# Patient Record
Sex: Male | Born: 1940 | Race: White | Hispanic: No | Smoking: Former smoker
Health system: Southern US, Community
[De-identification: ages and names within clinical notes are randomized; demographics above are authoritative.]

## PROBLEM LIST (undated history)

## (undated) DIAGNOSIS — M1990 Unspecified osteoarthritis, unspecified site: Secondary | ICD-10-CM

## (undated) DIAGNOSIS — M542 Cervicalgia: Secondary | ICD-10-CM

## (undated) DIAGNOSIS — I10 Essential (primary) hypertension: Secondary | ICD-10-CM

## (undated) DIAGNOSIS — G8929 Other chronic pain: Secondary | ICD-10-CM

## (undated) DIAGNOSIS — M545 Low back pain: Secondary | ICD-10-CM

## (undated) HISTORY — DX: Unspecified osteoarthritis, unspecified site: M19.90

## (undated) HISTORY — DX: Essential (primary) hypertension: I10

## (undated) HISTORY — DX: Other chronic pain: G89.29

## (undated) HISTORY — DX: Low back pain: M54.5

## (undated) HISTORY — DX: Cervicalgia: M54.2

---

## 1998-04-13 ENCOUNTER — Emergency Department (HOSPITAL_COMMUNITY): Admission: EM | Admit: 1998-04-13 | Discharge: 1998-04-13 | Payer: Self-pay | Admitting: Emergency Medicine

## 1998-04-14 ENCOUNTER — Emergency Department (HOSPITAL_COMMUNITY): Admission: EM | Admit: 1998-04-14 | Discharge: 1998-04-14 | Payer: Self-pay | Admitting: Internal Medicine

## 1998-04-15 ENCOUNTER — Emergency Department (HOSPITAL_COMMUNITY): Admission: EM | Admit: 1998-04-15 | Discharge: 1998-04-15 | Payer: Self-pay | Admitting: Emergency Medicine

## 1999-06-09 ENCOUNTER — Emergency Department (HOSPITAL_COMMUNITY): Admission: EM | Admit: 1999-06-09 | Discharge: 1999-06-09 | Payer: Self-pay | Admitting: Emergency Medicine

## 1999-06-09 ENCOUNTER — Encounter: Payer: Self-pay | Admitting: Emergency Medicine

## 1999-06-25 ENCOUNTER — Encounter: Payer: Self-pay | Admitting: Internal Medicine

## 1999-06-25 ENCOUNTER — Ambulatory Visit (HOSPITAL_COMMUNITY): Admission: RE | Admit: 1999-06-25 | Discharge: 1999-06-25 | Payer: Self-pay | Admitting: Internal Medicine

## 1999-06-25 ENCOUNTER — Encounter: Admission: RE | Admit: 1999-06-25 | Discharge: 1999-06-25 | Payer: Self-pay | Admitting: Internal Medicine

## 1999-07-02 ENCOUNTER — Encounter: Admission: RE | Admit: 1999-07-02 | Discharge: 1999-07-02 | Payer: Self-pay | Admitting: Internal Medicine

## 2002-06-16 ENCOUNTER — Ambulatory Visit (HOSPITAL_BASED_OUTPATIENT_CLINIC_OR_DEPARTMENT_OTHER): Admission: RE | Admit: 2002-06-16 | Discharge: 2002-06-16 | Payer: Self-pay | Admitting: Orthopedic Surgery

## 2002-11-09 ENCOUNTER — Emergency Department (HOSPITAL_COMMUNITY): Admission: EM | Admit: 2002-11-09 | Discharge: 2002-11-09 | Payer: Self-pay | Admitting: Emergency Medicine

## 2004-03-18 DIAGNOSIS — M545 Low back pain, unspecified: Secondary | ICD-10-CM

## 2004-03-18 DIAGNOSIS — G8929 Other chronic pain: Secondary | ICD-10-CM

## 2004-03-18 HISTORY — PX: SHOULDER SURGERY: SHX246

## 2004-03-18 HISTORY — DX: Other chronic pain: G89.29

## 2004-03-18 HISTORY — DX: Low back pain, unspecified: M54.50

## 2011-05-03 ENCOUNTER — Encounter: Payer: Self-pay | Admitting: Family Medicine

## 2011-05-03 ENCOUNTER — Ambulatory Visit (INDEPENDENT_AMBULATORY_CARE_PROVIDER_SITE_OTHER): Payer: BC Managed Care – PPO | Admitting: Family Medicine

## 2011-05-03 ENCOUNTER — Ambulatory Visit: Payer: BC Managed Care – PPO

## 2011-05-03 DIAGNOSIS — M5136 Other intervertebral disc degeneration, lumbar region: Secondary | ICD-10-CM | POA: Insufficient documentation

## 2011-05-03 DIAGNOSIS — Z0000 Encounter for general adult medical examination without abnormal findings: Secondary | ICD-10-CM

## 2011-05-03 DIAGNOSIS — M545 Low back pain: Secondary | ICD-10-CM

## 2011-05-03 DIAGNOSIS — Z1212 Encounter for screening for malignant neoplasm of rectum: Secondary | ICD-10-CM

## 2011-05-03 DIAGNOSIS — I10 Essential (primary) hypertension: Secondary | ICD-10-CM | POA: Insufficient documentation

## 2011-05-03 DIAGNOSIS — N400 Enlarged prostate without lower urinary tract symptoms: Secondary | ICD-10-CM | POA: Insufficient documentation

## 2011-05-03 DIAGNOSIS — E669 Obesity, unspecified: Secondary | ICD-10-CM | POA: Insufficient documentation

## 2011-05-03 DIAGNOSIS — Z23 Encounter for immunization: Secondary | ICD-10-CM

## 2011-05-03 LAB — COMPREHENSIVE METABOLIC PANEL
ALT: 22 U/L (ref 0–53)
AST: 21 U/L (ref 0–37)
Albumin: 4.7 g/dL (ref 3.5–5.2)
Alkaline Phosphatase: 93 U/L (ref 39–117)
BUN: 21 mg/dL (ref 6–23)
CO2: 23 mEq/L (ref 19–32)
Calcium: 9.3 mg/dL (ref 8.4–10.5)
Chloride: 101 mEq/L (ref 96–112)
Creat: 1.21 mg/dL (ref 0.50–1.35)
Glucose, Bld: 83 mg/dL (ref 70–99)
Potassium: 4.5 mEq/L (ref 3.5–5.3)
Sodium: 137 mEq/L (ref 135–145)
Total Bilirubin: 1.2 mg/dL (ref 0.3–1.2)
Total Protein: 7.4 g/dL (ref 6.0–8.3)

## 2011-05-03 LAB — LIPID PANEL
Cholesterol: 169 mg/dL (ref 0–200)
HDL: 35 mg/dL — ABNORMAL LOW (ref >39)
LDL Cholesterol: 110 mg/dL — ABNORMAL HIGH (ref 0–99)
Total CHOL/HDL Ratio: 4.8 Ratio
Triglycerides: 122 mg/dL (ref <150)
VLDL: 24 mg/dL (ref 0–40)

## 2011-05-03 MED ORDER — MUPIROCIN 2 % EX OINT: TOPICAL_OINTMENT | Freq: Three times a day (TID) | CUTANEOUS | Status: DC

## 2011-05-03 MED ORDER — TRAMADOL HCL 50 MG PO TABS: ORAL_TABLET | ORAL | Status: DC

## 2011-05-03 MED ORDER — MUPIROCIN 2 % EX OINT: TOPICAL_OINTMENT | Freq: Two times a day (BID) | CUTANEOUS | Status: AC

## 2011-05-03 NOTE — Progress Notes (Signed)
Subjective:    Patient ID: Bradley Farrell, male    DOB: 11/29/1940, 71 y.o.   MRN: [id number]  HPI     This 71 y.o.Cauc male is here for annual exam and some screening tests. He wants a Diabetes  screening test, colorectal screening and any other recommended evaluations. He has HTN and is compliant  with medication; no CP, dizziness, palpitations. No medication side effects. He is wondering whether he  is on adequate dosing as BP measurements outside this office are often higher than normal. He  acknowledges that he needs to lose weight and eat better . He is having increased chronic LBP,  aggravated by prolonged sitting. He feels sore/stiff in the AM upon awakening. He denies radicular  pain, numbness or weakness. His work involves heavy lifting which he is able to do without difficulty.      Review of Systems  Constitutional: Negative.   HENT:       Dry nasal passages not relieved with OTC salves and nasal sprays  Eyes: Negative.   Respiratory: Negative.   Cardiovascular: Negative.   Gastrointestinal: Negative.   Genitourinary: Positive for frequency, penile pain and testicular pain. Negative for dysuria, hematuria, decreased urine volume and difficulty urinating.       Positive for hesitancy and nocturia.  Musculoskeletal: Positive for back pain. Negative for arthralgias and gait problem.  Neurological: Negative.   Hematological: Negative.   Psychiatric/Behavioral: Positive for sleep disturbance.       Sleeps only 4-5 hours each night.       Objective:   Physical Exam  Vitals reviewed. Constitutional: He is oriented to person, place, and time. He appears well-developed and well-nourished. No distress.       Moderate abdominal obesity  HENT:  Head: Normocephalic and atraumatic.  Left Ear: External ear normal.  Mouth/Throat: Oropharynx is clear and moist.       Nasal mucosa erythematous and dry. No lesions   Neck: Normal range of motion. Neck supple. No thyromegaly  present.  Cardiovascular: Normal rate, regular rhythm, normal heart sounds and intact distal pulses.  Exam reveals no gallop.   No murmur heard.      Distal pulses- 1+/=  Pulmonary/Chest: Effort normal and breath sounds normal. No respiratory distress. He has no wheezes.  Abdominal: Soft. Bowel sounds are normal. He exhibits no mass. There is no tenderness.  Genitourinary: Guaiac positive stool.       Prostate enlarged and firm with right lobe larger and firmer than left  Musculoskeletal: He exhibits no edema and no tenderness.       Lower back: nontender with palpation. SLR - negative bilat to >60 degrees  Lymphadenopathy:    He has no cervical adenopathy.  Neurological: He is alert and oriented to person, place, and time. He exhibits normal muscle tone. Coordination normal.       DTRs 1+ in upper and lower ext.  Skin: Skin is warm and dry. No rash noted.  Psychiatric: He has a normal mood and affect. His behavior is normal. Judgment and thought content normal.   Results for orders placed in visit on 05/03/11  IFOBT (OCCULT BLOOD)      Component Value Range   IFOBT Positive     ECG: NSR, no STTW changes. No ectopy  [facility] reading (PRIMARY) by  Dr. Audria Nine: Lumbosacral spine films-  Moderately severe diffuse spondylosis and multilevel disc space narrowing  Assessment & Plan:   1. Routine general medical examination at a health care facility  Flu vaccine greater than or equal to 3yo preservative free IM, Comprehensive metabolic panel, Lipid panel, EKG 12-Lead, IFOBT POC (occult bld, rslt in office)  2. HTN (hypertension)  EKG 12-Lead; Continue Amlodipine 10 mg daily  3. Chronic low back pain  DG Lumbar Spine Complete; Tramadol HCl 50 mg   1 tab every 8 hrs prn or 1 tab at bedtime.  If pain persists, will need an MRI to R/O Nerve Impingement or HNP  4. Enlarged prostate  PSA pending; will need  Urologic evaluation  5. Immunization due  Flu vaccine given; pt received Pneumonia vaccine in 2009  6. Screening for colorectal cancer  Ambulatory referral to Gastroenterology  7. Obesity (BMI 30.0-34.9)  Offered Nutrition Counseling but pt declined; he has joined [employer] and plans to start swimming and adjust diet to promote weight loss.  8. Lumbar degenerative disc disease  Weight loss; Rx: Tramadol HCl 50 mg 1 tab every 8 hours or 1-2 at night for pain.  9. Health care maintenance  Ref to GI for Colorectal screening; pt had + Hemosure And is advised to have Colonoscopy.

## 2011-05-03 NOTE — Patient Instructions (Addendum)
Medications prescribed today:  Bactroban ointment to apply inside your nose twice a day (you will be getting the Generic) Tramadol 50 mg   Take one tablet at bedtime to help reduce back pain.     Colorectal Cancer Screening Colorectal cancer screening is done to detect early disease. Colorectal refers to the colon and rectum. The colon and rectum are located at the end of the large intestine (digestive system), and carry your bowel movements out of the body. Screening may be done even if you are not experiencing symptoms.  Colorectal cancer screening checks for:  Polyps. These are small growths in the lining of the colon that can turn cancerous.   Cancer that is already growing. Cancer is a cluster of abnormal cells that can cause problems in the body.  REASONS FOR COLORECTAL CANCER SCREENING  It is common for polyps to form in the lining of the colon, especially in older people. These polyps can be cancerous or become cancerous.   Caught early, colorectal cancer is treatable.   Cancer can be life threatening. Detecting or preventing cancer early can save your life and allow you to enjoy life longer.  TYPES OF SCREENING  Fecal occult blood testing. A stool sample is examined for blood in the laboratory.   Sigmoidoscopy. A sigmoidoscope is used to examine the rectum and lower colon. A sigmoidoscope is a flexible tube with a camera that is inserted through your anus to examine your lower rectum.   Colonoscopy. The longer colonoscope is used to examine the entire colon. A colonoscope is also a thin, flexible tube with a camera. This test examines the colon and rectum.  Other tests include:  Digital rectal exam.   Barium enema.   Stool DNA test.   Virtual colonoscopy is the use of computerized X-ray scan (computed tomography, CT) to take X-ray images of your colon.  WHO SHOULD HAVE COLORECTAL CANCER SCREENING?  Screening is recommended for all adults aged 89 to 75 years.    Screening is generally done every 5 to 10 years or more frequently if you have a family history or symptoms.  Screening is rarely recommended in adults aged 40 to 85 years. Screening is not recommended in adults aged 46 years and older. Your caregiver may recommend screening at a younger age and more frequent screening if you have:  A history of colorectal cancer or polyps.   Family members with histories of colorectal cancer or polyps.   Inflammatory bowel disease, such as ulcerative colitis or Crohn's disease.   A type of hereditary colon cancer syndrome.  Talk with your caregiver about any symptoms, personal and family history. SYMPTOMS OF COLORECTAL CANCER It is important to discuss the following symptoms with your caregiver. These symptoms may be the result of other conditions and may be easily treated:  Rectal bleeding.   Blood in your stool.   Changes in bowel movements (hard or loose stools). These changes may last several weeks.   Abdominal cramping.   Feeling the pressure to have a bowel movement when there is no bowel movement.   Feeling tired or weak.   Unexplained weight loss.   Unexplained low red blood cell count. This may also be called iron deficiency anemia.  HOME CARE INSTRUCTIONS   Follow up with your caregiver as directed.   Follow all instructions for preparation before your test as well as after.  PREVENTION  Following healthy lifestyle habits each day can reduce your chance of getting colorectal cancer and  many other types of cancer:  Eat a healthy, well-balanced diet rich in fruits and vegetables and low in fats, sugars and cholesterol.   Stay active. Try to exercise at least 4 to 6 times per week for 30 minutes.   Maintain a healthy weight. Ask your caregiver what a healthy weight range is for you.   Women should only drink 1 alcoholic drink per day. Men should only drink 2 alcoholic drinks per day.   Quit smoking.  SEEK MEDICAL CARE IF:    You experience abdominal or rectal symptoms (see Symptoms of Colorectal Cancer).   Your gastrointestinal issues (constipation, diarrhea) do not go away as expected.   You have questions or concerns.  FOR MORE INFORMATION  American Academy of Family Physicians www.familydoctor.org   Centers for Disease Control and Prevention FootballExhibition.com.br   [place] Preventive Services Task Force www.uspreventiveservicestaskforce.org   American Cancer Society www.cancer.org  MAKE SURE YOU:   Understand these instructions.   Will watch your condition.   Will get help right away if you are not doing well or get worse.  Always follow up with your caregiver to find out the results of your tests. Not all test results may be available during your visit. If your test results are not back during the visit, make an appointment with your caregiver to find out the results. Do not assume everything is normal if you have not heard from your caregiver or the medical facility. It is important for you to follow up on all of your test results.  Document Released: 08/22/2009 Document Revised: 11/14/2010 Document Reviewed: 08/22/2009 [employer] Patient Information 2012 [employer], [place]   Benign Prostatic Hypertrophy  The prostate gland is part of the reproductive system of men. A normal prostate is about the size and shape of a walnut. The prostate gland makes a fluid that is mixed with sperm to make semen. This gland surrounds the urethra and is located in front of the rectum and just below the bladder. The bladder is where urine is stored. The urethra is the tube through which urine passes from the bladder to get out of the body. The prostate grows as a man ages. An enlarged prostate not caused by cancer is called benign prostatic hypertrophy (BPH). This is a common health problem in men over age 41. This condition is a normal part of aging. An enlarged prostate presses on the urethra. This makes it harder to pass urine. In the  early stages of enlargement, the bladder can get by with a narrowed urethra by forcing the urine through. If the problem gets worse, medical or surgical treatment may be required.  This condition should be followed by your caregiver. Longstanding back pressure on the kidneys can cause infection. Back pressure and infection can progress to bladder damage and kidney (renal) failure. If needed, your caregiver may refer you to a specialist in kidney and prostate disease (urologist). CAUSES  The exact cause is not known.  SYMPTOMS   You are not able to completely empty your bladder.   Getting up often during the night to urinate.   Need to urinate frequently during the day.   Difficultly in starting urine flow.   Decrease in size and strength of the urine stream.   Dribbling after urination.   Pain on urination (more common with infection).   Inability to pass your water. This needs immediate treatment.  DIAGNOSIS  These tests will help your caregiver understand your problem:  Digital rectal exam (DRE). In  a rectal exam, your caregiver checks your prostate by putting a gloved, lubricated finger into the rectum to feel the back of your prostate gland. This exam detects the size of the gland and abnormal lumps or growths.   Urinalysis (exam of the urine). This may include a culture if there is concern about infection.   Prostate Specific Antigen (PSA). This is a blood test used to screen for prostate cancer. It is not used alone for diagnosing prostate cancer.   Rectal ultrasound (sonogram). This test uses sound waves to electronically produce a "picture" of the prostate. It helps examine the prostate gland for cancer.  TREATMENT  Mild symptoms may not need treatment. Simple observation and yearly exams may be all that is required. Medications and surgery are options for more severe problems. Your caregiver can help you make an informed decision for what is best. Two classes of medications  are available for relief of prostate symptoms:  Medications that shrink the prostate. This helps relieve symptoms.   Uncommon side effects include problems with sexual function.   Medications to relax the muscle of the prostate. This also relieves the obstruction.   Side effects can include dizziness, fatigue, lightheadedness, and retrograde ejaculation (diminished volume of ejaculate).  Several types of surgical treatments are available for relief of prostate symptoms:  Transurethral resection of the prostate (TURP). In this treatment, an instrument is inserted through opening at the tip of the penis. It is used to cut away pieces of the inner core of the prostate. The pieces are removed through the same opening of the penis. This removes the obstruction and helps get rid of the symptoms.   Transurethral incision (TUIP). In this procedure, small cuts are made in the prostate. This lessens the prostates pressure on the urethra.   Transurethral microwave thermotherapy (TUMT). This procedure uses microwaves to create heat. The heat destroys and removes a small amount of prostate tissue.   Transurethral needle ablation (TUNA). This is a procedure that uses radio frequencies to do the same as TUMT.   Interstitial laser coagulation (ILC). This is a procedure that uses a laser to do the same as TUMT and TUNA.   Transurethral electrovaporization (TUVP). This is a procedure that uses electrodes to do the same as the procedures listed above.  Regardless of the method of treatment chosen, you and your caregiver will discuss the options. With this knowledge, you along with your caregiver can decide upon the best treatment for you. SEEK MEDICAL CARE IF:   You develop chills, fever of 100.5 F (38.1 C), or night sweats.   There is unexplained back pain.   Symptoms are not helped by medications prescribed.   You develop medication side effects.   Your urine becomes very dark or has a bad smell.   SEEK IMMEDIATE MEDICAL CARE IF:   You are suddenly unable to urinate. This is an emergency. You should be seen immediately.   There are large amounts of blood or clots in the urine.   Your urinary problems become unmanageable.   You develop lightheadedness, severe dizziness, or you feel faint.   You develop moderate to severe low back or flank pain.   You develop chills or fever.  Document Released: 03/04/2005 Document Revised: 11/14/2010 Document Reviewed: 11/24/2006 [facility] Patient Information 2012 [place], [place].   .Degenerative Disc Disease Degenerative disc disease is a condition caused by the changes that occur in the cushions of the backbone (spinal discs) as you grow older. Spinal discs  are soft and compressible discs located between the bones of the spine (vertebrae). They act like shock absorbers. Degenerative disc disease can affect the wholespine. However, the neck and lower back are most commonly affected. Many changes can occur in the spinal discs with aging, such as:  The spinal discs may dry and shrink.   Small tears may occur in the tough, outer covering of the disc (annulus).   The disc space may become smaller due to loss of water.   Abnormal growths in the bone (spurs) may occur. This can put pressure on the nerve roots exiting the spinal canal, causing pain.   The spinal canal may become narrowed.  CAUSES  Degenerative disc disease is a condition caused by the changes that occur in the spinal discs with aging. The exact cause is not known, but there is a genetic basis for many patients. Degenerative changes can occur due to loss of fluid in the disc. This makes the disc thinner and reduces the space between the backbones. Small cracks can develop in the outer layer of the disc. This can lead to the breakdown of the disc. You are more likely to get degenerative disc disease if you are overweight. Smoking cigarettes and doing heavy work such as weightlifting can  also increase your risk of this condition. Degenerative changes can start after a sudden injury. Growth of bone spurs can compress the nerve roots and cause pain.  SYMPTOMS  The symptoms vary from person to person. Some people may have no pain, while others have severe pain. The pain may be so severe that it can limit your activities. The location of the pain depends on the part of your backbone that is affected. You will have neck or arm pain if a disc in the neck area is affected. You will have pain in your back, buttocks, or legs if a disc in the lower back is affected. The pain becomes worse while bending, reaching up, or with twisting movements. The pain may start gradually and then get worse as time passes. It may also start after a major or minor injury. You may feel numbness or tingling in the arms or legs.  DIAGNOSIS  Your caregiver will ask you about your symptoms and about activities or habits that may cause the pain. He or she may also ask about any injuries, diseases, ortreatments you have had earlier. Your caregiver will examine you to check for the range of movement that is possible in the affected area, to check for strength in your extremities, and to check for sensation in the areas of the arms and legs supplied by different nerve roots. An X-ray of the spine may be taken. Your caregiver may suggest other imaging tests, such as a computerized magnetic scan (MRI), if needed.  TREATMENT  Treatment includes rest, modifying your activities, and applying ice and heat. Your caregiver may prescribe medicines to reduce your pain and may ask you to do some exercises to strengthen your back. In some cases, you may need surgery. You and your caregiver will decide on the treatment that is best for you. HOME CARE INSTRUCTIONS   Follow proper lifting and walking techniques as advised by your caregiver.   Maintain good posture.   Exercise regularly as advised.   Perform relaxation exercises.    Change your sitting, standing, and sleeping habits as advised. Change positions frequently.   Lose weight as advised.   Stop smoking if you smoke.   Wear supportive footwear.  SEEK MEDICAL CARE IF:  The pain does not go away within 1 to 4 weeks. SEEK IMMEDIATE MEDICAL CARE IF:   The pain is severe.   You notice weakness in your arms, hands, or legs.   You begin to lose control of your bladder or bowel.  MAKE SURE YOU:   Understand these instructions.   Will watch your condition.   Will get help right away if you are not doing well or get worse.  Document Released: 12/30/2006 Document Revised: 11/14/2010 Document Reviewed: 12/30/2006 [facility] Patient Information 2012 [place], [place].

## 2011-05-08 NOTE — Progress Notes (Signed)
Quick Note:  Please call pt and advise that Lipid profile show low HDL ("good") cholesterol and LDL ("bad") chol is a little elevated. Best way to normalize these is with regular exercise and healthy nutrition which includes Omega 3 Fish Oil and fish - Salmon, tuna and mackerel. PSA (prostate test) Is normal.  Provide copy of labs to pt. ______

## 2011-06-05 ENCOUNTER — Other Ambulatory Visit: Payer: Self-pay

## 2011-06-05 MED ORDER — AMLODIPINE BESYLATE 10 MG PO TABS: 10.0000 mg | ORAL_TABLET | Freq: Every day | ORAL | Status: DC

## 2011-06-05 NOTE — Telephone Encounter (Signed)
Pt came into [address] lobby and stated his BP med had not been RFd at his CPE last month and asked if we could send it into pharmacy. Sending Rx to [facility]

## 2011-06-07 ENCOUNTER — Ambulatory Visit (INDEPENDENT_AMBULATORY_CARE_PROVIDER_SITE_OTHER): Payer: BC Managed Care – PPO | Admitting: Internal Medicine

## 2011-06-07 VITALS — BP 126/81 | HR 93 | Temp 98.0°F | Resp 16 | Ht 71.0 in | Wt 231.0 lb

## 2011-06-07 DIAGNOSIS — M1990 Unspecified osteoarthritis, unspecified site: Secondary | ICD-10-CM

## 2011-06-07 DIAGNOSIS — J9801 Acute bronchospasm: Secondary | ICD-10-CM

## 2011-06-07 DIAGNOSIS — R05 Cough: Secondary | ICD-10-CM

## 2011-06-07 DIAGNOSIS — M129 Arthropathy, unspecified: Secondary | ICD-10-CM

## 2011-06-07 DIAGNOSIS — R059 Cough, unspecified: Secondary | ICD-10-CM

## 2011-06-07 DIAGNOSIS — G8929 Other chronic pain: Secondary | ICD-10-CM

## 2011-06-07 MED ORDER — ALBUTEROL SULFATE HFA 108 (90 BASE) MCG/ACT IN AERS: 2.0000 | INHALATION_SPRAY | Freq: Four times a day (QID) | RESPIRATORY_TRACT | Status: AC | PRN

## 2011-06-07 MED ORDER — HYDROCODONE-ACETAMINOPHEN 7.5-500 MG/15ML PO SOLN: 5.0000 mL | Freq: Four times a day (QID) | ORAL | Status: AC | PRN

## 2011-06-07 MED ORDER — METHYLPREDNISOLONE ACETATE 80 MG/ML IJ SUSP
80.0000 mg | Freq: Once | INTRAMUSCULAR | Status: AC
  Administered 2011-06-07: 80 mg via INTRAMUSCULAR

## 2011-06-07 MED ORDER — HYDROCODONE-ACETAMINOPHEN 5-500 MG PO TABS: 1.0000 | ORAL_TABLET | Freq: Three times a day (TID) | ORAL | Status: AC | PRN

## 2011-06-07 NOTE — Patient Instructions (Signed)
Bronchospasm  A bronchospasm is when the tubes that carry air in and out of your lungs (bronchioles) become smaller. It is hard to breathe when this happens. A bronchospasm can be caused by:   Asthma.   Allergies.   Lung infection.  HOME CARE    Do not  smoke. Avoid places that have secondhand smoke.   Dust your house often. Have your air ducts cleaned once or twice a year.   Find out what allergies may cause your bronchospasms.   Use your inhaler properly if you have one. Know when to use it.   Eat healthy foods and drink plenty of water.   Only take medicine as told by your doctor.  GET HELP RIGHT AWAY IF:   You feel you cannot breathe or catch your breath.   You cannot stop coughing.   Your treatment is not helping you breathe better.  MAKE SURE YOU:    Understand these instructions.   Will watch your condition.   Will get help right away if you are not doing well or get worse.  Document Released: 12/30/2008 Document Revised: 02/21/2011 Document Reviewed: 12/30/2008  [employer] Patient Information 2012 [employer].

## 2011-06-07 NOTE — Progress Notes (Signed)
  Subjective:    Patient ID: Bradley Farrell, male    DOB: [birth year], 71 y.o.   MRN: [id number]  HPI Persistnt cough after a cold. Hx of cxr at cpe recently. No sob, quit smoking over 1 yr. No sputum Wants cough controlled so he can go to work   Review of Systems     Objective:   Physical Exam  Constitutional: He is oriented to person, place, and time. He appears well-developed and well-nourished.  HENT:  Right Ear: External ear normal.  Left Ear: External ear normal.  Nose: Nose normal.  Mouth/Throat: Oropharynx is clear and moist.  Cardiovascular: Normal rate and regular rhythm.   Pulmonary/Chest: Effort normal. He has no decreased breath sounds. He has wheezes. He has rhonchi. He has no rales.  Neurological: He is alert and oriented to person, place, and time.  Skin: Skin is warm and dry.  Psychiatric: He has a normal mood and affect.          Assessment & Plan:  Bronchospasm Cough Chronic pain  Lortab elixir for cough Albuterol HFA prn and taught Vicodin 5/500 prn pain Depomedrol 120mg  IM

## 2011-07-09 ENCOUNTER — Encounter: Payer: Self-pay | Admitting: Gastroenterology

## 2011-12-21 ENCOUNTER — Other Ambulatory Visit: Payer: Self-pay | Admitting: Family Medicine

## 2012-01-24 ENCOUNTER — Ambulatory Visit (INDEPENDENT_AMBULATORY_CARE_PROVIDER_SITE_OTHER): Payer: BC Managed Care – PPO | Admitting: Emergency Medicine

## 2012-01-24 VITALS — BP 111/71 | HR 80 | Resp 16 | Ht 71.0 in | Wt 234.0 lb

## 2012-01-24 DIAGNOSIS — J018 Other acute sinusitis: Secondary | ICD-10-CM

## 2012-01-24 MED ORDER — AMOXICILLIN-POT CLAVULANATE 875-125 MG PO TABS: 1.0000 | ORAL_TABLET | Freq: Two times a day (BID) | ORAL | Status: DC

## 2012-01-24 MED ORDER — AMLODIPINE BESYLATE 10 MG PO TABS: 10.0000 mg | ORAL_TABLET | Freq: Every day | ORAL | Status: DC

## 2012-01-24 MED ORDER — PSEUDOEPHEDRINE-GUAIFENESIN ER 60-600 MG PO TB12: 1.0000 | ORAL_TABLET | Freq: Two times a day (BID) | ORAL | Status: DC

## 2012-01-24 NOTE — Progress Notes (Signed)
[facility] [address] [place] [address] [phone]  Date:  01/24/2012   Name:  Bradley Farrell   DOB:  December 16, 1940   MRN:  [id number]  PCP:  No primary provider on file.    Chief Complaint: Sore Throat and Medication Refill   History of Present Illness:  Bradley Farrell is a 71 y.o. very pleasant male patient who presents with the following:  Nasal congestion and clear nasal discharge.  Has green purulent post nasal drainage.  Has non productive cough occasionally green in color.  No fever or chills, wheezing or short of breath.  Exposed to a large amount of dust at work and is not wearing a respiratory.  No nausea or vomiting.  Patient Active Problem List  Diagnosis  . HTN (hypertension)  . Obesity (BMI 30.0-34.9)  . Lumbar degenerative disc disease  . Enlarged prostate  . Health care maintenance    Past Medical History  Diagnosis Date  . Arthritis   . Hypertension     No past surgical history on file.  History  Substance Use Topics  . Smoking status: Former Smoker    Types: Cigarettes    Quit date: 04/01/2010  . Smokeless tobacco: Not on file  . Alcohol Use: No    No family history on file.  No Known Allergies  Medication list has been reviewed and updated.  Current Outpatient Prescriptions on File Prior to Visit  Medication Sig Dispense Refill  . amLODipine (NORVASC) 10 MG tablet TAKE ONE TABLET BY MOUTH EVERY DAY  90 tablet  0  . aspirin 81 MG tablet Take 81 mg by mouth daily.      Marland Kitchen albuterol (PROVENTIL HFA;VENTOLIN HFA) 108 (90 BASE) MCG/ACT inhaler Inhale 2 puffs into the lungs every 6 (six) hours as needed for wheezing.  1 Inhaler  0    Review of Systems:  As per HPI, otherwise negative.    Physical Examination: Filed Vitals:   01/24/12 1435  BP: 111/71  Pulse: 80  Resp: 16   Filed Vitals:   01/24/12 1435  Height: 5\' 11"  (1.803 m)  Weight: 234 lb (106.142 kg)   Body mass index is 32.64 kg/(m^2). Ideal Body  Weight: Weight in (lb) to have BMI = 25: 178.9   GEN: WDWN, NAD, Non-toxic, A & O x 3.  No rash, sepsis or shortness of breath HEENT: Atraumatic, Normocephalic. Neck supple. No masses, No LAD.  Oropharynx negative Ears and Nose: No external deformity.  TM negative.  Green nasal drainage.  Tender maxillary sinuses CV: RRR, No M/G/R. No JVD. No thrill. No extra heart sounds. PULM: CTA B, no wheezes, crackles, rhonchi. No retractions. No resp. distress. No accessory muscle use. ABD: S, NT, ND, +BS. No rebound. No HSM. EXTR: No c/c/e NEURO Normal gait.  PSYCH: Normally interactive. Conversant. Not depressed or anxious appearing.  Calm demeanor.    Assessment and Plan: Sinusitis augmentin mucinex d Follow up as needed  Carmelina Dane, MD  Patient was very unhappy that he had no refills called in on his antihypertensive other than 30 days worth.  He was last seen in the office in February by Dr Audria Nine. I told him that we don't like to treat hypertension with a year prescription and that I would happily extend his prescription as he requested since he was seen.  He was not happy that he was not given "something to help him relax" so he could sleep, as he  had a lifelong history of insomnia that was never treated.  I suggested that he see his regular physician for that.  He became vulgar and cursed repeatedly and said he would not tolerate being "preached to" at his age.  Eleanora Neighbor was in the room for the entire interaction.

## 2012-01-26 ENCOUNTER — Telehealth: Payer: Self-pay

## 2012-01-26 NOTE — Telephone Encounter (Signed)
Dr. Dareen Piano suggests he tries OTC robatussin or delsym.

## 2012-01-26 NOTE — Telephone Encounter (Signed)
Patient notified. He stated that he was not taking OTC medication, he has come up here to get care for the cough and he has insurance and come up here with a co-pay to get medication and he is not getting any medication for the cough. He said it is a hard cough that is keeping him up at night. He wanted me to tell Dr. Dareen Piano that if this is the best he has then he will get his lawyer and find out what is up with him in court.

## 2012-01-26 NOTE — Telephone Encounter (Signed)
He would like something for cough. He has been coughing all night and non-stop.

## 2012-01-26 NOTE — Telephone Encounter (Signed)
Patient is not happy with just the antibiotics rececieved Friday.  He would like something in addition to them.  [facility] [place]  [phone] (H)

## 2012-01-29 NOTE — Progress Notes (Signed)
Reviewed and agree.

## 2012-06-13 ENCOUNTER — Ambulatory Visit (INDEPENDENT_AMBULATORY_CARE_PROVIDER_SITE_OTHER): Payer: BC Managed Care – PPO | Admitting: Family Medicine

## 2012-06-13 VITALS — BP 150/81 | HR 83 | Temp 97.8°F | Resp 16 | Ht 70.0 in | Wt 232.4 lb

## 2012-06-13 DIAGNOSIS — J209 Acute bronchitis, unspecified: Secondary | ICD-10-CM

## 2012-06-13 MED ORDER — LEVOFLOXACIN 500 MG PO TABS: 500.0000 mg | ORAL_TABLET | Freq: Every day | ORAL | Status: DC

## 2012-06-13 MED ORDER — PREDNISONE 20 MG PO TABS: ORAL_TABLET | ORAL | Status: DC

## 2012-06-13 NOTE — Patient Instructions (Addendum)

## 2012-06-13 NOTE — Progress Notes (Signed)
Patient ID: Bradley Farrell MRN: [id number], DOB: 1940/09/28, 72 y.o. Date of Encounter: 06/13/2012, 10:21 AM  Primary Physician: No primary provider on file.  Chief Complaint:  Chief Complaint  Patient presents with  . Cough    X  1 WEEK  CHEST CONGESTION  . Hypertension    MED REFILL     HPI: 72 y.o. year old male presents with a 14 day history of nasal congestion, post nasal drip, sore throat, and cough. Mild sinus pressure. Afebrile. No chills. Nasal congestion thick and green/yellow. Cough is productive of green/yellow sputum and not associated with time of day. Ears feel full, leading to sensation of muffled hearing. Has tried OTC cold preps without success. No GI complaints.   Patient does Holiday representative work.  Quit cigs 2 years ago  No sick contacts, recent antibiotics, or recent travels.   No leg trauma, sedentary periods, h/o cancer, or tobacco use.  Past Medical History  Diagnosis Date  . Arthritis   . Hypertension      Home Meds: Prior to Admission medications   Medication Sig Start Date End Date Taking? Authorizing Provider  amLODipine (NORVASC) 10 MG tablet Take 1 tablet (10 mg total) by mouth daily. 01/24/12  Yes Phillips Odor, MD  aspirin 81 MG tablet Take 81 mg by mouth daily.   Yes Historical Provider, MD    Allergies: No Known Allergies  History   Social History  . Marital Status: Divorced    Spouse Name: N/A    Number of Children: N/A  . Years of Education: N/A   Occupational History  . Not on file.   Social History Main Topics  . Smoking status: Former Smoker    Types: Cigarettes    Quit date: 04/01/2010  . Smokeless tobacco: Not on file  . Alcohol Use: No  . Drug Use: No  . Sexually Active: Yes   Other Topics Concern  . Not on file   Social History Narrative  . No narrative on file     Review of Systems: Constitutional: negative for chills, fever, night sweats or weight changes Cardiovascular: negative for chest pain or  palpitations Respiratory: negative for hemoptysis, wheezing, or shortness of breath Abdominal: negative for abdominal pain, nausea, vomiting or diarrhea Dermatological: negative for rash Neurologic: negative for headache   Physical Exam: Blood pressure 150/81, pulse 83, temperature 97.8 F (36.6 C), temperature source Oral, resp. rate 16, height 5\' 10"  (1.778 m), weight 232 lb 6.4 oz (105.416 kg), SpO2 98.00%., Body mass index is 33.35 kg/(m^2). General: Well developed, well nourished, in no acute distress. Head: Normocephalic, atraumatic, eyes without discharge, sclera non-icteric, nares are congested. Bilateral auditory canals clear, TM's are without perforation, pearly grey with reflective cone of light bilaterally. No sinus TTP. Oral cavity moist, dentition normal. Posterior pharynx with post nasal drip and mild erythema. No peritonsillar abscess or tonsillar exudate. Neck: Supple. No thyromegaly. Full ROM. No lymphadenopathy. Lungs: Coarse breath sounds bilaterally without wheezes, rales, or rhonchi. Breathing is unlabored.  Heart: RRR with S1 S2. No murmurs, rubs, or gallops appreciated. Msk:  Strength and tone normal for age. Extremities: No clubbing or cyanosis. No edema. Neuro: Alert and oriented X 3. Moves all extremities spontaneously. CNII-XII grossly in tact. Psych:  Responds to questions appropriately with a normal affect.    ASSESSMENT AND PLAN:  72 y.o. year old male with bronchitis. Acute bronchitis - Plan: levofloxacin (LEVAQUIN) 500 MG tablet, predniSONE (DELTASONE) 20 MG tablet   - -Mucinex -  Tylenol/Motrin prn -Rest/fluids -RTC precautions -RTC 3-5 days if no improvement  Signed, Elvina Sidle, MD 06/13/2012 10:21 AM

## 2012-07-11 ENCOUNTER — Ambulatory Visit (INDEPENDENT_AMBULATORY_CARE_PROVIDER_SITE_OTHER): Payer: BC Managed Care – PPO | Admitting: Physician Assistant

## 2012-07-11 VITALS — BP 135/84 | HR 73 | Temp 98.5°F | Resp 16 | Ht 71.0 in | Wt 232.0 lb

## 2012-07-11 DIAGNOSIS — J069 Acute upper respiratory infection, unspecified: Secondary | ICD-10-CM

## 2012-07-11 DIAGNOSIS — H9319 Tinnitus, unspecified ear: Secondary | ICD-10-CM | POA: Insufficient documentation

## 2012-07-11 DIAGNOSIS — M542 Cervicalgia: Secondary | ICD-10-CM | POA: Insufficient documentation

## 2012-07-11 DIAGNOSIS — J309 Allergic rhinitis, unspecified: Secondary | ICD-10-CM

## 2012-07-11 DIAGNOSIS — R600 Localized edema: Secondary | ICD-10-CM

## 2012-07-11 DIAGNOSIS — R609 Edema, unspecified: Secondary | ICD-10-CM

## 2012-07-11 MED ORDER — HYDROCOD POLST-CHLORPHEN POLST 10-8 MG/5ML PO LQCR: 5.0000 mL | Freq: Two times a day (BID) | ORAL | Status: DC | PRN

## 2012-07-11 MED ORDER — FLUTICASONE PROPIONATE 50 MCG/ACT NA SUSP: 2.0000 | Freq: Every day | NASAL | Status: DC

## 2012-07-11 MED ORDER — AZITHROMYCIN 250 MG PO TABS: ORAL_TABLET | ORAL | Status: DC

## 2012-07-11 NOTE — Patient Instructions (Addendum)
Get plenty of rest and drink at least 64 ounces of water daily. Please schedule a complete physical at your earliest convenience.  THE 3 SIMPLE RULES FOR NASAL SPRAY USE: 1. Don't snort. 2. Look at your toes and spray up your nose. 3. Use the opposite hand to spray in both nostrils.

## 2012-07-11 NOTE — Progress Notes (Signed)
  Subjective:    Patient ID: Bradley Farrell, male    DOB: 01/03/1941, 72 y.o.   MRN: [id number]  HPI This 72 y.o. male presents for evaluation of Cough and congestion. "I get this every month."  1 month ago, diagnosed with bronchitis, treated with Levaquin and prednisone.  Several months prior, Bradley Farrell was treated for sinusitis with Augmentin.  Notes that Bradley Farrell works primarily indoors, in Research scientist (life sciences) spaces, with people who have been sneezing and coughing, and not covering their mouths.  Past medical history, surgical history, family history, social history and problem list reviewed.  I last saw Bradley Farrell 08/31/2010. Last CPE was 04/2011 with Dr. Audria Nine.  Review of Systems As above. Notes several months of mild LE edema.  Resolves with leg elevation. Associated with long standing.  No associated pain. Otherwise NEGATIVE.    Objective:   Physical Exam Blood pressure 135/84, pulse 73, temperature 98.5 F (36.9 C), temperature source Oral, resp. rate 16, height 5\' 11"  (1.803 m), weight 232 lb (105.235 kg), SpO2 98.00%. Body mass index is 32.37 kg/(m^2). Well-developed, well nourished WM who is awake, alert and oriented, in NAD. HEENT: [place]/AT, PERRL, EOMI.  Sclera and conjunctiva are clear.  EAC are patent, TMs are normal in appearance. Nasal mucosa is pink and moist. OP is clear (compensated upper edentula). Neck: supple, non-tender, no lymphadenopathy, thyromegaly. Heart: RRR, no murmur Lungs: normal effort, CTA Extremities: no cyanosis, clubbing or edema. Skin: warm and dry without rash. Psychologic: good mood and appropriate affect, normal speech and behavior.     Assessment & Plan:  Viral URI with cough - Plan: azithromycin (ZITHROMAX) 250 MG tablet, as Bradley Farrell reports that when Bradley Farrell gets these symptoms, we ALWAYS needs an antibiotic; chlorpheniramine-HYDROcodone (TUSSIONEX PENNKINETIC ER) 10-8 MG/5ML LQCR  Allergic rhinitis - Plan: fluticasone (FLONASE) 50 MCG/ACT nasal spray; hopefully improved control  will prevent recurrent sinusitis.  Edema extremities - likely due to Amlodipine and long standing required of Bradley Farrell work.  Since it's not bothering Bradley Farrell, and Bradley Farrell BP is well controlled, Bradley Farrell will continue it for now.  If needed, it can be changed in the future.  Patient Instructions  Get plenty of rest and drink at least 64 ounces of water daily. Please schedule a complete physical at your earliest convenience.  THE 3 SIMPLE RULES FOR NASAL SPRAY USE: 1. Don't snort. 2. Look at your toes and spray up your nose. 3. Use the opposite hand to spray in both nostrils.    Fernande Bras, PA-C Physician Assistant-Certified [facility]

## 2012-09-15 ENCOUNTER — Other Ambulatory Visit: Payer: Self-pay | Admitting: Emergency Medicine

## 2012-09-16 NOTE — Telephone Encounter (Signed)
Patient due for labs/ov

## 2012-10-28 ENCOUNTER — Other Ambulatory Visit: Payer: Self-pay | Admitting: Physician Assistant

## 2012-11-12 ENCOUNTER — Ambulatory Visit (INDEPENDENT_AMBULATORY_CARE_PROVIDER_SITE_OTHER): Payer: BC Managed Care – PPO | Admitting: Family Medicine

## 2012-11-12 VITALS — BP 130/82 | HR 95 | Temp 98.0°F | Resp 18 | Ht 70.5 in | Wt 240.0 lb

## 2012-11-12 DIAGNOSIS — I10 Essential (primary) hypertension: Secondary | ICD-10-CM

## 2012-11-12 LAB — BASIC METABOLIC PANEL
BUN: 20 mg/dL (ref 6–23)
Potassium: 4.4 mEq/L (ref 3.5–5.3)
Sodium: 140 mEq/L (ref 135–145)

## 2012-11-12 LAB — POCT URINALYSIS DIPSTICK
Bilirubin, UA: NEGATIVE
Blood, UA: NEGATIVE
Glucose, UA: NEGATIVE
Ketones, UA: NEGATIVE
Leukocytes, UA: NEGATIVE
Nitrite, UA: NEGATIVE
Protein, UA: NEGATIVE
Spec Grav, UA: 1.025
Urobilinogen, UA: 0.2
pH, UA: 5.5

## 2012-11-12 LAB — BASIC METABOLIC PANEL WITH GFR
CO2: 29 meq/L (ref 19–32)
Calcium: 9.6 mg/dL (ref 8.4–10.5)
Chloride: 101 meq/L (ref 96–112)
Creat: 1.33 mg/dL (ref 0.50–1.35)
Glucose, Bld: 131 mg/dL — ABNORMAL HIGH (ref 70–99)

## 2012-11-12 MED ORDER — AMLODIPINE BESYLATE 10 MG PO TABS: 10.0000 mg | ORAL_TABLET | Freq: Every day | ORAL | Status: DC
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
## 2012-11-12 NOTE — Patient Instructions (Signed)
Hypertension  As your heart beats, it forces blood through your arteries. This force is your blood pressure. If the pressure is too high, it is called hypertension (HTN) or high blood pressure. HTN is dangerous because you may have it and not know it. High blood pressure may mean that your heart has to work harder to pump blood. Your arteries may be narrow or stiff. The extra work puts you at risk for heart disease, stroke, and other problems.   Blood pressure consists of two numbers, a higher number over a lower, 110/72, for example. It is stated as "110 over 72." The ideal is below 120 for the top number (systolic) and under 80 for the bottom (diastolic). Write down your blood pressure today.  You should pay close attention to your blood pressure if you have certain conditions such as:   Heart failure.   Prior heart attack.   Diabetes   Chronic kidney disease.   Prior stroke.   Multiple risk factors for heart disease.  To see if you have HTN, your blood pressure should be measured while you are seated with your arm held at the level of the heart. It should be measured at least twice. A one-time elevated blood pressure reading (especially in the Emergency Department) does not mean that you need treatment. There may be conditions in which the blood pressure is different between your right and left arms. It is important to see your caregiver soon for a recheck.  Most people have essential hypertension which means that there is not a specific cause. This type of high blood pressure may be lowered by changing lifestyle factors such as:   Stress.   Smoking.   Lack of exercise.   Excessive weight.   Drug/tobacco/alcohol use.   Eating less salt.  Most people do not have symptoms from high blood pressure until it has caused damage to the body. Effective treatment can often prevent, delay or reduce that damage.  TREATMENT    When a cause has been identified, treatment for high blood pressure is directed at the cause. There are a large number of medications to treat HTN. These fall into several categories, and your caregiver will help you select the medicines that are best for you. Medications may have side effects. You should review side effects with your caregiver.  If your blood pressure stays high after you have made lifestyle changes or started on medicines,    Your medication(s) may need to be changed.   Other problems may need to be addressed.   Be certain you understand your prescriptions, and know how and when to take your medicine.   Be sure to follow up with your caregiver within the time frame advised (usually within two weeks) to have your blood pressure rechecked and to review your medications.   If you are taking more than one medicine to lower your blood pressure, make sure you know how and at what times they should be taken. Taking two medicines at the same time can result in blood pressure that is too low.  SEEK IMMEDIATE MEDICAL CARE IF:   You develop a severe headache, blurred or changing vision, or confusion.   You have unusual weakness or numbness, or a faint feeling.   You have severe chest or abdominal pain, vomiting, or breathing problems.  MAKE SURE YOU:    Understand these instructions.   Will watch your condition.   Will get help right away if you are not doing well   or get worse.  Document Released: 03/04/2005 Document Revised: 05/27/2011 Document Reviewed: 10/23/2007  ExitCare Patient Information 2014 [employer]  DASH Diet   The DASH diet stands for "Dietary Approaches to Stop Hypertension." It is a healthy eating plan that has been shown to reduce high blood pressure (hypertension) in as little as 14 days, while also possibly providing other significant health benefits. These other health benefits include reducing the risk of breast cancer after menopause and reducing the risk of type 2 diabetes, heart disease, colon cancer, and stroke. Health benefits also include weight loss and slowing kidney failure in patients with chronic kidney disease.   DIET GUIDELINES   Limit salt (sodium). Your diet should contain less than 1500 mg of sodium daily.   Limit refined or processed carbohydrates. Your diet should include mostly whole grains. Desserts and added sugars should be used sparingly.   Include small amounts of heart-healthy fats. These types of fats include nuts, oils, and tub margarine. Limit saturated and trans fats. These fats have been shown to be harmful in the body.  CHOOSING FOODS   The following food groups are based on a 2000 calorie diet. See your Registered Dietitian for individual calorie needs.  Grains and Grain Products (6 to 8 servings daily)   Eat More Often: Whole-wheat bread, brown rice, whole-grain or wheat pasta, quinoa, popcorn without added fat or salt (air popped).   Eat Less Often: White bread, white pasta, white rice, cornbread.  Vegetables (4 to 5 servings daily)   Eat More Often: Fresh, frozen, and canned vegetables. Vegetables may be raw, steamed, roasted, or grilled with a minimal amount of fat.   Eat Less Often/Avoid: Creamed or fried vegetables. Vegetables in a cheese sauce.  Fruit (4 to 5 servings daily)   Eat More Often: All fresh, canned (in natural juice), or frozen fruits. Dried fruits without added sugar. One hundred percent fruit juice ( cup [237 mL] daily).   Eat Less Often: Dried fruits with added sugar. Canned fruit in light or heavy syrup.   Lean Meats, Fish, and Poultry (2 servings or less daily. One serving is 3 to 4 oz [85-114 g]).   Eat More Often: Ninety percent or leaner ground beef, tenderloin, sirloin. Round cuts of beef, chicken breast, turkey breast. All fish. Grill, bake, or broil your meat. Nothing should be fried.   Eat Less Often/Avoid: Fatty cuts of meat, turkey, or chicken leg, thigh, or wing. Fried cuts of meat or fish.  Dairy (2 to 3 servings)   Eat More Often: Low-fat or fat-free milk, low-fat plain or light yogurt, reduced-fat or part-skim cheese.   Eat Less Often/Avoid: Milk (whole, 2%).Whole milk yogurt. Full-fat cheeses.  Nuts, Seeds, and Legumes (4 to 5 servings per week)   Eat More Often: All without added salt.   Eat Less Often/Avoid: Salted nuts and seeds, canned beans with added salt.  Fats and Sweets (limited)   Eat More Often: Vegetable oils, tub margarines without trans fats, sugar-free gelatin. Mayonnaise and salad dressings.   Eat Less Often/Avoid: Coconut oils, palm oils, butter, stick margarine, cream, half and half, cookies, candy, pie.  FOR MORE INFORMATION  The Dash Diet Eating Plan: www.dashdiet.org  Document Released: 02/21/2011 Document Revised: 05/27/2011 Document Reviewed: 02/21/2011  [employer] Patient Information 2014 [employer]

## 2012-12-02 ENCOUNTER — Telehealth: Payer: Self-pay | Admitting: Radiology

## 2012-12-02 NOTE — Telephone Encounter (Addendum)
Patient came in, for his lab results, I have advised him of the lab results. His glucose is elevated, he was not fasting. I have advised him at some point he will need to come in for fasting labs, to check this, please advise what do you want him to have lab only visit? What does he need?   12/03/12 Spoke to patient he doe snot need to worry He can get his fasting sugars checked if he wants at work, parameters given. If he like he can also come in next visit and get fasting labs. Just come early in Am. No s/sx DM.  Tle

## 2013-03-13 ENCOUNTER — Ambulatory Visit (INDEPENDENT_AMBULATORY_CARE_PROVIDER_SITE_OTHER): Payer: BC Managed Care – PPO | Admitting: Family Medicine

## 2013-03-13 VITALS — BP 132/90 | HR 88 | Temp 97.8°F | Resp 16 | Ht 70.0 in | Wt 238.0 lb

## 2013-03-13 DIAGNOSIS — R112 Nausea with vomiting, unspecified: Secondary | ICD-10-CM

## 2013-03-13 DIAGNOSIS — K529 Noninfective gastroenteritis and colitis, unspecified: Secondary | ICD-10-CM

## 2013-03-13 DIAGNOSIS — I10 Essential (primary) hypertension: Secondary | ICD-10-CM

## 2013-03-13 DIAGNOSIS — R7309 Other abnormal glucose: Secondary | ICD-10-CM

## 2013-03-13 LAB — POCT CBC
Hemoglobin: 14.7 g/dL (ref 14.1–18.1)
Lymph, poc: 1.4 (ref 0.6–3.4)
MCH, POC: 30.1 pg (ref 27–31.2)
MCHC: 31.7 g/dL — AB (ref 31.8–35.4)
MID (cbc): 0.4 (ref 0–0.9)
MPV: 9.2 fL (ref 0–99.8)
POC LYMPH PERCENT: 9.8 %L — AB (ref 10–50)
POC MID %: 2.9 %M (ref 0–12)
RDW, POC: 13.9 %
WBC: 14.5 10*3/uL — AB (ref 4.6–10.2)

## 2013-03-13 LAB — COMPREHENSIVE METABOLIC PANEL
Albumin: 4.8 g/dL (ref 3.5–5.2)
Alkaline Phosphatase: 90 U/L (ref 39–117)
BUN: 16 mg/dL (ref 6–23)
Calcium: 9.7 mg/dL (ref 8.4–10.5)
Chloride: 99 mEq/L (ref 96–112)
Glucose, Bld: 113 mg/dL — ABNORMAL HIGH (ref 70–99)
Potassium: 4.7 mEq/L (ref 3.5–5.3)

## 2013-03-13 LAB — POCT URINALYSIS DIPSTICK
Blood, UA: NEGATIVE
Glucose, UA: NEGATIVE
Spec Grav, UA: 1.02
Urobilinogen, UA: 0.2

## 2013-03-13 MED ORDER — ONDANSETRON 8 MG PO TBDP: 8.0000 mg | ORAL_TABLET | Freq: Three times a day (TID) | ORAL | Status: DC | PRN
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
## 2013-03-13 NOTE — Patient Instructions (Signed)
Viral Gastroenteritis Viral gastroenteritis is also known as stomach flu. This condition affects the stomach and intestinal tract. It can cause sudden diarrhea and vomiting. The illness typically lasts 3 to 8 days. Most people develop an immune response that eventually gets rid of the virus. While this natural response develops, the virus can make you quite ill. CAUSES  Many different viruses can cause gastroenteritis, such as rotavirus or noroviruses. You can catch one of these viruses by consuming contaminated food or water. You may also catch a virus by sharing utensils or other personal items with an infected person or by touching a contaminated surface. SYMPTOMS  The most common symptoms are diarrhea and vomiting. These problems can cause a severe loss of body fluids (dehydration) and a body salt (electrolyte) imbalance. Other symptoms may include:  Fever.  Headache.  Fatigue.  Abdominal pain. DIAGNOSIS  Your caregiver can usually diagnose viral gastroenteritis based on your symptoms and a physical exam. A stool sample may also be taken to test for the presence of viruses or other infections. TREATMENT  This illness typically goes away on its own. Treatments are aimed at rehydration. The most serious cases of viral gastroenteritis involve vomiting so severely that you are not able to keep fluids down. In these cases, fluids must be given through an intravenous line (IV). HOME CARE INSTRUCTIONS   Drink enough fluids to keep your urine clear or pale yellow. Drink small amounts of fluids frequently and increase the amounts as tolerated.  Ask your caregiver for specific rehydration instructions.  Avoid:  Foods high in sugar.  Alcohol.  Carbonated drinks.  Tobacco.  Juice.  Caffeine drinks.  Extremely hot or cold fluids.  Fatty, greasy foods.  Too much intake of anything at one time.  Dairy products until 24 to 48 hours after diarrhea stops.  You may consume probiotics.  Probiotics are active cultures of beneficial bacteria. They may lessen the amount and number of diarrheal stools in adults. Probiotics can be found in yogurt with active cultures and in supplements.  Wash your hands well to avoid spreading the virus.  Only take over-the-counter or prescription medicines for pain, discomfort, or fever as directed by your caregiver. Do not give aspirin to children. Antidiarrheal medicines are not recommended.  Ask your caregiver if you should continue to take your regular prescribed and over-the-counter medicines.  Keep all follow-up appointments as directed by your caregiver. SEEK IMMEDIATE MEDICAL CARE IF:   You are unable to keep fluids down.  You do not urinate at least once every 6 to 8 hours.  You develop shortness of breath.  You notice blood in your stool or vomit. This may look like coffee grounds.  You have abdominal pain that increases or is concentrated in one small area (localized).  You have persistent vomiting or diarrhea.  You have a fever.  The patient is a child younger than 3 months, and he or she has a fever.  The patient is a child older than 3 months, and he or she has a fever and persistent symptoms.  The patient is a child older than 3 months, and he or she has a fever and symptoms suddenly get worse.  The patient is a baby, and he or she has no tears when crying. MAKE SURE YOU:   Understand these instructions.  Will watch your condition.  Will get help right away if you are not doing well or get worse. Document Released: 03/04/2005 Document Revised: 05/27/2011 Document Reviewed: 12/19/2010   ExitCare Patient Information 2014 [employer].  

## 2013-03-17 ENCOUNTER — Encounter: Payer: Self-pay | Admitting: Family Medicine

## 2013-10-25 ENCOUNTER — Ambulatory Visit (INDEPENDENT_AMBULATORY_CARE_PROVIDER_SITE_OTHER): Payer: BC Managed Care – PPO | Admitting: Family Medicine

## 2013-10-25 VITALS — BP 142/88 | HR 87 | Temp 98.4°F | Resp 16 | Ht 70.5 in | Wt 244.0 lb

## 2013-10-25 DIAGNOSIS — R202 Paresthesia of skin: Secondary | ICD-10-CM

## 2013-10-25 DIAGNOSIS — R209 Unspecified disturbances of skin sensation: Secondary | ICD-10-CM

## 2013-10-25 DIAGNOSIS — R200 Anesthesia of skin: Secondary | ICD-10-CM

## 2013-10-25 DIAGNOSIS — Z131 Encounter for screening for diabetes mellitus: Secondary | ICD-10-CM

## 2013-10-25 DIAGNOSIS — I10 Essential (primary) hypertension: Secondary | ICD-10-CM

## 2013-10-25 LAB — COMPLETE METABOLIC PANEL WITH GFR
ALT: 24 U/L (ref 0–53)
CO2: 30 mEq/L (ref 19–32)
Chloride: 101 mEq/L (ref 96–112)
GFR, Est African American: 62 mL/min
Glucose, Bld: 94 mg/dL (ref 70–99)
Potassium: 4.6 mEq/L (ref 3.5–5.3)
Sodium: 138 mEq/L (ref 135–145)
Total Bilirubin: 0.8 mg/dL (ref 0.2–1.2)
Total Protein: 7.4 g/dL (ref 6.0–8.3)

## 2013-10-25 LAB — POCT GLYCOSYLATED HEMOGLOBIN (HGB A1C): Hemoglobin A1C: 5.6

## 2013-10-25 LAB — COMPLETE METABOLIC PANEL WITHOUT GFR
AST: 21 U/L (ref 0–37)
Albumin: 4.7 g/dL (ref 3.5–5.2)
Alkaline Phosphatase: 110 U/L (ref 39–117)
BUN: 18 mg/dL (ref 6–23)
Calcium: 9.8 mg/dL (ref 8.4–10.5)
Creat: 1.33 mg/dL (ref 0.50–1.35)
GFR, Est Non African American: 53 mL/min — ABNORMAL LOW

## 2013-10-25 LAB — MICROALBUMIN, URINE: Microalb, Ur: 1.1 mg/dL (ref 0.00–1.89)

## 2013-10-25 MED ORDER — AMLODIPINE BESYLATE 10 MG PO TABS: 10.0000 mg | ORAL_TABLET | Freq: Every day | ORAL | Status: DC

## 2013-10-25 MED ORDER — GABAPENTIN 100 MG PO CAPS: 100.0000 mg | ORAL_CAPSULE | Freq: Every evening | ORAL | Status: DC | PRN

## 2013-10-25 NOTE — Patient Instructions (Signed)
DASH Eating Plan °DASH stands for "Dietary Approaches to Stop Hypertension." The DASH eating plan is a healthy eating plan that has been shown to reduce high blood pressure (hypertension). Additional health benefits may include reducing the risk of type 2 diabetes mellitus, heart disease, and stroke. The DASH eating plan may also help with weight loss. °WHAT DO I NEED TO KNOW ABOUT THE DASH EATING PLAN? °For the DASH eating plan, you will follow these general guidelines: °· Choose foods with a percent daily value for sodium of less than 5% (as listed on the food label). °· Use salt-free seasonings or herbs instead of table salt or sea salt. °· Check with your health care provider or pharmacist before using salt substitutes. °· Eat lower-sodium products, often labeled as "lower sodium" or "no salt added." °· Eat fresh foods. °· Eat more vegetables, fruits, and low-fat dairy products. °· Choose whole grains. Look for the word "whole" as the first word in the ingredient list. °· Choose fish and skinless chicken or turkey more often than red meat. Limit fish, poultry, and meat to 6 oz (170 g) each day. °· Limit sweets, desserts, sugars, and sugary drinks. °· Choose heart-healthy fats. °· Limit cheese to 1 oz (28 g) per day. °· Eat more home-cooked food and less restaurant, buffet, and fast food. °· Limit fried foods. °· Cook foods using methods other than frying. °· Limit canned vegetables. If you do use them, rinse them well to decrease the sodium. °· When eating at a restaurant, ask that your food be prepared with less salt, or no salt if possible. °WHAT FOODS CAN I EAT? °Seek help from a dietitian for individual calorie needs. °Grains °Whole grain or whole wheat bread. Brown rice. Whole grain or whole wheat pasta. Quinoa, bulgur, and whole grain cereals. Low-sodium cereals. Corn or whole wheat flour tortillas. Whole grain cornbread. Whole grain crackers. Low-sodium crackers. °Vegetables °Fresh or frozen vegetables  (raw, steamed, roasted, or grilled). Low-sodium or reduced-sodium tomato and vegetable juices. Low-sodium or reduced-sodium tomato sauce and paste. Low-sodium or reduced-sodium canned vegetables.  °Fruits °All fresh, canned (in natural juice), or frozen fruits. °Meat and Other Protein Products °Ground beef (85% or leaner), grass-fed beef, or beef trimmed of fat. Skinless chicken or turkey. Ground chicken or turkey. Pork trimmed of fat. All fish and seafood. Eggs. Dried beans, peas, or lentils. Unsalted nuts and seeds. Unsalted canned beans. °Dairy °Low-fat dairy products, such as skim or 1% milk, 2% or reduced-fat cheeses, low-fat ricotta or cottage cheese, or plain low-fat yogurt. Low-sodium or reduced-sodium cheeses. °Fats and Oils °Tub margarines without trans fats. Light or reduced-fat mayonnaise and salad dressings (reduced sodium). Avocado. Safflower, olive, or canola oils. Natural peanut or almond butter. °Other °Unsalted popcorn and pretzels. °The items listed above may not be a complete list of recommended foods or beverages. Contact your dietitian for more options. °WHAT FOODS ARE NOT RECOMMENDED? °Grains °White bread. White pasta. White rice. Refined cornbread. Bagels and croissants. Crackers that contain trans fat. °Vegetables °Creamed or fried vegetables. Vegetables in a cheese sauce. Regular canned vegetables. Regular canned tomato sauce and paste. Regular tomato and vegetable juices. °Fruits °Dried fruits. Canned fruit in light or heavy syrup. Fruit juice. °Meat and Other Protein Products °Fatty cuts of meat. Ribs, chicken wings, bacon, sausage, bologna, salami, chitterlings, fatback, hot dogs, bratwurst, and packaged luncheon meats. Salted nuts and seeds. Canned beans with salt. °Dairy °Whole or 2% milk, cream, half-and-half, and cream cheese. Whole-fat or sweetened yogurt. Full-fat   cheeses or blue cheese. Nondairy creamers and whipped toppings. Processed cheese, cheese spreads, or cheese  curds. °Condiments °Onion and garlic salt, seasoned salt, table salt, and sea salt. Canned and packaged gravies. Worcestershire sauce. Tartar sauce. Barbecue sauce. Teriyaki sauce. Soy sauce, including reduced sodium. Steak sauce. Fish sauce. Oyster sauce. Cocktail sauce. Horseradish. Ketchup and mustard. Meat flavorings and tenderizers. Bouillon cubes. Hot sauce. Tabasco sauce. Marinades. Taco seasonings. Relishes. °Fats and Oils °Butter, stick margarine, lard, shortening, ghee, and bacon fat. Coconut, palm kernel, or palm oils. Regular salad dressings. °Other °Pickles and olives. Salted popcorn and pretzels. °The items listed above may not be a complete list of foods and beverages to avoid. Contact your dietitian for more information. °WHERE CAN I FIND MORE INFORMATION? °National Heart, Lung, and Blood Institute: www.nhlbi.nih.gov/health/health-topics/topics/dash/ °Document Released: 02/21/2011 Document Revised: 07/19/2013 Document Reviewed: 01/06/2013 °ExitCare® Patient Information ©2015 [employer]. This information is not intended to replace advice given to you by your health care provider. Make sure you discuss any questions you have with your health care provider. ° °
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
## 2013-10-26 ENCOUNTER — Observation Stay (HOSPITAL_COMMUNITY)
Admission: EM | Admit: 2013-10-26 | Discharge: 2013-10-27 | Disposition: A | Discharge status: Home / Self Care | Payer: Medicare Other | Attending: General Surgery | Admitting: General Surgery

## 2013-10-26 ENCOUNTER — Encounter (HOSPITAL_COMMUNITY): Payer: Medicare Other | Admitting: Registered Nurse

## 2013-10-26 ENCOUNTER — Emergency Department (HOSPITAL_COMMUNITY): Payer: Medicare Other

## 2013-10-26 ENCOUNTER — Ambulatory Visit (INDEPENDENT_AMBULATORY_CARE_PROVIDER_SITE_OTHER): Payer: BC Managed Care – PPO

## 2013-10-26 ENCOUNTER — Ambulatory Visit (INDEPENDENT_AMBULATORY_CARE_PROVIDER_SITE_OTHER): Payer: BC Managed Care – PPO | Admitting: Family Medicine

## 2013-10-26 ENCOUNTER — Emergency Department (HOSPITAL_COMMUNITY): Payer: Medicare Other | Admitting: Registered Nurse

## 2013-10-26 ENCOUNTER — Encounter (HOSPITAL_COMMUNITY): Payer: Self-pay | Admitting: Emergency Medicine

## 2013-10-26 ENCOUNTER — Encounter (HOSPITAL_COMMUNITY)
Admission: EM | Disposition: A | Discharge status: Home / Self Care | Payer: Self-pay | Source: Home / Self Care | Attending: Emergency Medicine

## 2013-10-26 VITALS — BP 138/90 | HR 87 | Temp 98.7°F | Resp 16

## 2013-10-26 DIAGNOSIS — K8020 Calculus of gallbladder without cholecystitis without obstruction: Secondary | ICD-10-CM | POA: Diagnosis not present

## 2013-10-26 DIAGNOSIS — K3580 Unspecified acute appendicitis: Principal | ICD-10-CM | POA: Insufficient documentation

## 2013-10-26 DIAGNOSIS — M549 Dorsalgia, unspecified: Secondary | ICD-10-CM | POA: Diagnosis not present

## 2013-10-26 DIAGNOSIS — Z79899 Other long term (current) drug therapy: Secondary | ICD-10-CM | POA: Insufficient documentation

## 2013-10-26 DIAGNOSIS — Z7982 Long term (current) use of aspirin: Secondary | ICD-10-CM | POA: Insufficient documentation

## 2013-10-26 DIAGNOSIS — R1013 Epigastric pain: Secondary | ICD-10-CM

## 2013-10-26 DIAGNOSIS — M542 Cervicalgia: Secondary | ICD-10-CM | POA: Diagnosis not present

## 2013-10-26 DIAGNOSIS — I10 Essential (primary) hypertension: Secondary | ICD-10-CM | POA: Diagnosis not present

## 2013-10-26 DIAGNOSIS — Z880 Allergy status to penicillin: Secondary | ICD-10-CM | POA: Diagnosis not present

## 2013-10-26 DIAGNOSIS — R109 Unspecified abdominal pain: Secondary | ICD-10-CM

## 2013-10-26 DIAGNOSIS — G8929 Other chronic pain: Secondary | ICD-10-CM | POA: Diagnosis not present

## 2013-10-26 DIAGNOSIS — R100 Acute abdomen: Secondary | ICD-10-CM

## 2013-10-26 DIAGNOSIS — K37 Unspecified appendicitis: Secondary | ICD-10-CM | POA: Diagnosis present

## 2013-10-26 DIAGNOSIS — K3589 Other acute appendicitis without perforation or gangrene: Secondary | ICD-10-CM

## 2013-10-26 HISTORY — PX: LAPAROSCOPIC APPENDECTOMY: SHX408

## 2013-10-26 LAB — CBC WITH DIFFERENTIAL/PLATELET
Basophils Absolute: 0 10*3/uL (ref 0.0–0.1)
Basophils Relative: 0 % (ref 0–1)
Eosinophils Absolute: 0 10*3/uL (ref 0.0–0.7)
Eosinophils Relative: 0 % (ref 0–5)
HEMATOCRIT: 46.6 % (ref 39.0–52.0)
HEMOGLOBIN: 15.9 g/dL (ref 13.0–17.0)
LYMPHS ABS: 0.9 10*3/uL (ref 0.7–4.0)
LYMPHS PCT: 5 % — AB (ref 12–46)
MCH: 30.1 pg (ref 26.0–34.0)
MCHC: 34.1 g/dL (ref 30.0–36.0)
MCV: 88.1 fL (ref 78.0–100.0)
MONO ABS: 0.8 10*3/uL (ref 0.1–1.0)
Monocytes Relative: 5 % (ref 3–12)
Neutro Abs: 15.2 10*3/uL — ABNORMAL HIGH (ref 1.7–7.7)
Neutrophils Relative %: 90 % — ABNORMAL HIGH (ref 43–77)
PLATELETS: 179 10*3/uL (ref 150–400)
RBC: 5.29 MIL/uL (ref 4.22–5.81)
RDW: 13 % (ref 11.5–15.5)
WBC: 16.9 10*3/uL — AB (ref 4.0–10.5)

## 2013-10-26 LAB — URINALYSIS, ROUTINE W REFLEX MICROSCOPIC
BILIRUBIN URINE: NEGATIVE
Glucose, UA: NEGATIVE mg/dL
Hgb urine dipstick: NEGATIVE
KETONES UR: NEGATIVE mg/dL
LEUKOCYTES UA: NEGATIVE
NITRITE: NEGATIVE
PH: 7 (ref 5.0–8.0)
Protein, ur: NEGATIVE mg/dL
SPECIFIC GRAVITY, URINE: 1.02 (ref 1.005–1.030)
UROBILINOGEN UA: 0.2 mg/dL (ref 0.0–1.0)

## 2013-10-26 LAB — COMPREHENSIVE METABOLIC PANEL
ALT: 27 U/L (ref 0–53)
AST: 24 U/L (ref 0–37)
Albumin: 4.2 g/dL (ref 3.5–5.2)
Alkaline Phosphatase: 84 U/L (ref 39–117)
Anion gap: 15 (ref 5–15)
BUN: 17 mg/dL (ref 6–23)
CALCIUM: 9.5 mg/dL (ref 8.4–10.5)
CO2: 23 mEq/L (ref 19–32)
Chloride: 98 mEq/L (ref 96–112)
Creatinine, Ser: 1.09 mg/dL (ref 0.50–1.35)
GFR calc Af Amer: 77 mL/min — ABNORMAL LOW (ref >90)
GFR calc non Af Amer: 66 mL/min — ABNORMAL LOW (ref >90)
GLUCOSE: 161 mg/dL — AB (ref 70–99)
Potassium: 4.5 mEq/L (ref 3.7–5.3)
SODIUM: 136 meq/L — AB (ref 137–147)
Total Bilirubin: 1.5 mg/dL — ABNORMAL HIGH (ref 0.3–1.2)
Total Protein: 7.6 g/dL (ref 6.0–8.3)

## 2013-10-26 LAB — POCT CBC
GRANULOCYTE PERCENT: 91.1 % — AB (ref 37–80)
HEMATOCRIT: 49.4 % (ref 43.5–53.7)
HEMOGLOBIN: 16.2 g/dL (ref 14.1–18.1)
Lymph, poc: 1.4 (ref 0.6–3.4)
MCH, POC: 29.6 pg (ref 27–31.2)
MCHC: 32.7 g/dL (ref 31.8–35.4)
MCV: 90.6 fL (ref 80–97)
MID (cbc): 0.3 (ref 0–0.9)
MPV: 8.6 fL (ref 0–99.8)
POC Granulocyte: 17.4 — AB (ref 2–6.9)
POC LYMPH PERCENT: 7.2 %L — AB (ref 10–50)
POC MID %: 1.7 %M (ref 0–12)
Platelet Count, POC: 228 10*3/uL (ref 142–424)
RBC: 5.45 M/uL (ref 4.69–6.13)
RDW, POC: 13.8 %
WBC: 19.1 10*3/uL — AB (ref 4.6–10.2)

## 2013-10-26 LAB — LIPASE, BLOOD: Lipase: 15 U/L (ref 11–59)

## 2013-10-26 LAB — LIPASE: Lipase: 10 U/L (ref 0–75)

## 2013-10-26 SURGERY — APPENDECTOMY, LAPAROSCOPIC
Anesthesia: General

## 2013-10-26 MED ORDER — PROPOFOL 10 MG/ML IV BOLUS
INTRAVENOUS | Status: AC
  Filled 2013-10-26: qty 20

## 2013-10-26 MED ORDER — FENTANYL CITRATE 0.05 MG/ML IJ SOLN
INTRAMUSCULAR | Status: AC
  Filled 2013-10-26: qty 5

## 2013-10-26 MED ORDER — BUPIVACAINE HCL (PF) 0.25 % IJ SOLN
INTRAMUSCULAR | Status: DC | PRN
Start: 2013-10-26 — End: 2013-10-26
  Administered 2013-10-26: 20 mL

## 2013-10-26 MED ORDER — DEXTROSE 5 % IV SOLN
1.0000 g | Freq: Two times a day (BID) | INTRAVENOUS | Status: DC
  Administered 2013-10-27: 1 g via INTRAVENOUS
  Filled 2013-10-26 (×3): qty 1

## 2013-10-26 MED ORDER — KCL IN DEXTROSE-NACL 20-5-0.45 MEQ/L-%-% IV SOLN
INTRAVENOUS | Status: DC
  Administered 2013-10-26: 1000 mL via INTRAVENOUS
  Administered 2013-10-27: 04:00:00 via INTRAVENOUS
  Filled 2013-10-26 (×3): qty 1000

## 2013-10-26 MED ORDER — LIDOCAINE HCL (CARDIAC) 20 MG/ML IV SOLN
INTRAVENOUS | Status: AC
  Filled 2013-10-26: qty 5

## 2013-10-26 MED ORDER — DEXAMETHASONE SODIUM PHOSPHATE 10 MG/ML IJ SOLN
INTRAMUSCULAR | Status: DC | PRN
Start: 2013-10-26 — End: 2013-10-26
  Administered 2013-10-26: 10 mg via INTRAVENOUS

## 2013-10-26 MED ORDER — BUPIVACAINE HCL (PF) 0.25 % IJ SOLN
INTRAMUSCULAR | Status: AC
  Filled 2013-10-26: qty 30

## 2013-10-26 MED ORDER — IOHEXOL 300 MG/ML  SOLN
100.0000 mL | Freq: Once | INTRAMUSCULAR | Status: AC | PRN
  Administered 2013-10-26: 100 mL via INTRAVENOUS

## 2013-10-26 MED ORDER — AMLODIPINE BESYLATE 10 MG PO TABS
10.0000 mg | ORAL_TABLET | Freq: Every day | ORAL | Status: DC
  Administered 2013-10-27: 10 mg via ORAL
  Filled 2013-10-26: qty 1

## 2013-10-26 MED ORDER — ROCURONIUM BROMIDE 100 MG/10ML IV SOLN
INTRAVENOUS | Status: DC | PRN
Start: 2013-10-26 — End: 2013-10-26
  Administered 2013-10-26: 40 mg via INTRAVENOUS

## 2013-10-26 MED ORDER — HYDROMORPHONE HCL PF 1 MG/ML IJ SOLN
1.0000 mg | Freq: Once | INTRAMUSCULAR | Status: AC
  Administered 2013-10-26: 1 mg via INTRAVENOUS
  Filled 2013-10-26: qty 1

## 2013-10-26 MED ORDER — HYDROMORPHONE HCL PF 1 MG/ML IJ SOLN: 0.2500 mg | INTRAMUSCULAR | Status: DC | PRN

## 2013-10-26 MED ORDER — PROPOFOL 10 MG/ML IV BOLUS
INTRAVENOUS | Status: DC | PRN
  Administered 2013-10-26: 250 mg via INTRAVENOUS

## 2013-10-26 MED ORDER — SODIUM CHLORIDE 0.9 % IV SOLN
INTRAVENOUS | Status: DC
  Administered 2013-10-26: 12:00:00 via INTRAVENOUS

## 2013-10-26 MED ORDER — CEFOTETAN DISODIUM-DEXTROSE 2-2.08 GM-% IV SOLR
INTRAVENOUS | Status: AC
  Filled 2013-10-26: qty 50

## 2013-10-26 MED ORDER — SUCCINYLCHOLINE CHLORIDE 20 MG/ML IJ SOLN
INTRAMUSCULAR | Status: DC | PRN
  Administered 2013-10-26: 100 mg via INTRAVENOUS

## 2013-10-26 MED ORDER — LACTATED RINGERS IR SOLN
Status: DC | PRN
  Administered 2013-10-26: 2000 mL

## 2013-10-26 MED ORDER — HEPARIN SODIUM (PORCINE) 5000 UNIT/ML IJ SOLN
5000.0000 [IU] | Freq: Three times a day (TID) | INTRAMUSCULAR | Status: DC
  Administered 2013-10-27: 5000 [IU] via SUBCUTANEOUS
  Filled 2013-10-26 (×4): qty 1

## 2013-10-26 MED ORDER — ONDANSETRON HCL 4 MG PO TABS: 4.0000 mg | ORAL_TABLET | Freq: Four times a day (QID) | ORAL | Status: DC | PRN

## 2013-10-26 MED ORDER — MORPHINE SULFATE 2 MG/ML IJ SOLN: 1.0000 mg | INTRAMUSCULAR | Status: DC | PRN

## 2013-10-26 MED ORDER — HYDROCODONE-ACETAMINOPHEN 5-325 MG PO TABS: 1.0000 | ORAL_TABLET | ORAL | Status: DC | PRN

## 2013-10-26 MED ORDER — GLYCOPYRROLATE 0.2 MG/ML IJ SOLN
INTRAMUSCULAR | Status: DC | PRN
  Administered 2013-10-26: .8 mg via INTRAVENOUS

## 2013-10-26 MED ORDER — NEOSTIGMINE METHYLSULFATE 10 MG/10ML IV SOLN
INTRAVENOUS | Status: DC | PRN
  Administered 2013-10-26: 5 mg via INTRAVENOUS

## 2013-10-26 MED ORDER — FENTANYL CITRATE 0.05 MG/ML IJ SOLN
INTRAMUSCULAR | Status: DC | PRN
  Administered 2013-10-26 (×4): 50 ug via INTRAVENOUS

## 2013-10-26 MED ORDER — DEXAMETHASONE SODIUM PHOSPHATE 10 MG/ML IJ SOLN
INTRAMUSCULAR | Status: AC
  Filled 2013-10-26: qty 1

## 2013-10-26 MED ORDER — ONDANSETRON HCL 4 MG/2ML IJ SOLN: 4.0000 mg | Freq: Four times a day (QID) | INTRAMUSCULAR | Status: DC | PRN

## 2013-10-26 MED ORDER — PROPOFOL 10 MG/ML IV BOLUS
INTRAVENOUS | Status: AC
  Filled 2013-10-26: qty 40

## 2013-10-26 MED ORDER — MIDAZOLAM HCL 2 MG/2ML IJ SOLN
INTRAMUSCULAR | Status: AC
  Filled 2013-10-26: qty 2

## 2013-10-26 MED ORDER — ONDANSETRON HCL 4 MG/2ML IJ SOLN
INTRAMUSCULAR | Status: AC
  Filled 2013-10-26: qty 2

## 2013-10-26 MED ORDER — LIDOCAINE HCL (CARDIAC) 20 MG/ML IV SOLN
INTRAVENOUS | Status: DC | PRN
Start: 2013-10-26 — End: 2013-10-26
  Administered 2013-10-26: 100 mg via INTRAVENOUS

## 2013-10-26 MED ORDER — PROMETHAZINE HCL 25 MG/ML IJ SOLN: 6.2500 mg | INTRAMUSCULAR | Status: DC | PRN

## 2013-10-26 MED ORDER — IBUPROFEN 200 MG PO TABS: 600.0000 mg | ORAL_TABLET | Freq: Four times a day (QID) | ORAL | Status: DC | PRN

## 2013-10-26 MED ORDER — ONDANSETRON HCL 4 MG/2ML IJ SOLN
INTRAMUSCULAR | Status: DC | PRN
  Administered 2013-10-26: 4 mg via INTRAVENOUS

## 2013-10-26 MED ORDER — LACTATED RINGERS IV SOLN
INTRAVENOUS | Status: DC | PRN
  Administered 2013-10-26 (×2): via INTRAVENOUS

## 2013-10-26 MED ORDER — CEFOTETAN DISODIUM-DEXTROSE 2-2.08 GM-% IV SOLR
2.0000 g | Freq: Once | INTRAVENOUS | Status: AC
  Administered 2013-10-26: 2 g via INTRAVENOUS

## 2013-10-26 MED ORDER — FENTANYL CITRATE 0.05 MG/ML IJ SOLN
INTRAMUSCULAR | Status: AC
  Filled 2013-10-26: qty 2

## 2013-10-26 MED ORDER — ONDANSETRON HCL 4 MG/2ML IJ SOLN
4.0000 mg | Freq: Once | INTRAMUSCULAR | Status: AC
  Administered 2013-10-26: 4 mg via INTRAVENOUS
  Filled 2013-10-26: qty 2

## 2013-10-26 SURGICAL SUPPLY — 42 items
APPLIER CLIP ROT 10 11.4 M/L (STAPLE)
BENZOIN TINCTURE PRP APPL 2/3 (GAUZE/BANDAGES/DRESSINGS) IMPLANT
CANISTER SUCTION 2500CC (MISCELLANEOUS) IMPLANT
CLIP APPLIE ROT 10 11.4 M/L (STAPLE) IMPLANT
CLOSURE WOUND 1/2 X4 (GAUZE/BANDAGES/DRESSINGS)
CUTTER FLEX LINEAR 45M (STAPLE) ×3 IMPLANT
DECANTER SPIKE VIAL GLASS SM (MISCELLANEOUS) IMPLANT
DERMABOND ADVANCED (GAUZE/BANDAGES/DRESSINGS)
DERMABOND ADVANCED .7 DNX12 (GAUZE/BANDAGES/DRESSINGS) IMPLANT
DRAPE LAPAROSCOPIC ABDOMINAL (DRAPES) ×3 IMPLANT
ELECT REM PT RETURN 9FT ADLT (ELECTROSURGICAL) ×3
ELECTRODE REM PT RTRN 9FT ADLT (ELECTROSURGICAL) ×1 IMPLANT
ENDOLOOP SUT PDS II  0 18 (SUTURE)
ENDOLOOP SUT PDS II 0 18 (SUTURE) IMPLANT
GLOVE BIOGEL PI IND STRL 7.0 (GLOVE) ×1 IMPLANT
GLOVE BIOGEL PI INDICATOR 7.0 (GLOVE) ×2
GLOVE SURG SIGNA 7.5 PF LTX (GLOVE) ×6 IMPLANT
GOWN SPEC L4 XLG W/TWL (GOWN DISPOSABLE) ×3 IMPLANT
GOWN STRL REUS W/ TWL XL LVL3 (GOWN DISPOSABLE) ×3 IMPLANT
GOWN STRL REUS W/TWL LRG LVL3 (GOWN DISPOSABLE) ×3 IMPLANT
GOWN STRL REUS W/TWL XL LVL3 (GOWN DISPOSABLE) ×6
KIT BASIN OR (CUSTOM PROCEDURE TRAY) ×3 IMPLANT
MANIFOLD NEPTUNE II (INSTRUMENTS) ×3 IMPLANT
PENCIL BUTTON HOLSTER BLD 10FT (ELECTRODE) IMPLANT
POUCH SPECIMEN RETRIEVAL 10MM (ENDOMECHANICALS) ×3 IMPLANT
RELOAD 45 VASCULAR/THIN (ENDOMECHANICALS) IMPLANT
RELOAD STAPLE TA45 3.5 REG BLU (ENDOMECHANICALS) ×3 IMPLANT
SET IRRIG TUBING LAPAROSCOPIC (IRRIGATION / IRRIGATOR) ×3 IMPLANT
SHEARS HARMONIC ACE PLUS 36CM (ENDOMECHANICALS) ×3 IMPLANT
SLEEVE ENDOPATH XCEL 5M (ENDOMECHANICALS) ×3 IMPLANT
SOLUTION ANTI FOG 6CC (MISCELLANEOUS) ×3 IMPLANT
STRIP CLOSURE SKIN 1/2X4 (GAUZE/BANDAGES/DRESSINGS) IMPLANT
SUT MON AB 5-0 PS2 18 (SUTURE) ×3 IMPLANT
SUT VICRYL 0 UR6 27IN ABS (SUTURE) ×6 IMPLANT
TOWEL OR 17X26 10 PK STRL BLUE (TOWEL DISPOSABLE) ×3 IMPLANT
TOWEL OR NON WOVEN STRL DISP B (DISPOSABLE) ×3 IMPLANT
TRAY FOLEY CATH 14FRSI W/METER (CATHETERS) IMPLANT
TRAY LAP CHOLE (CUSTOM PROCEDURE TRAY) ×3 IMPLANT
TROCAR BLADELESS OPT 5 100 (ENDOMECHANICALS) ×3 IMPLANT
TROCAR XCEL BLUNT TIP 100MML (ENDOMECHANICALS) ×3 IMPLANT
TROCAR XCEL NON-BLD 11X100MML (ENDOMECHANICALS) ×3 IMPLANT
TUBING INSUFFLATION 10FT LAP (TUBING) ×3 IMPLANT
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
## 2013-10-26 NOTE — Progress Notes (Signed)
Pt refused ht and wt

## 2013-10-26 NOTE — Op Note (Signed)
Re:   Bradley Farrell DOB:   08/29/1940 MRN:   [id number]                   [facility]  DATE OF PROCEDURE: 10/26/2013                              OPERATIVE REPORT  PREOPERATIVE DIAGNOSIS:  Appendicitis  POSTOPERATIVE DIAGNOSIS:  Acute suppurative appendicitis.  PROCEDURE:  Laparoscopic appendectomy.  SURGEON:  Sandria Balesavid H. Ezzard StandingNewman, MD  ASSISTANT:  No first assistant.  ANESTHESIA:  General endotracheal.  Anesthesiologist: Kennieth Radobert E Fitzgerald, MD CRNA: Epimenio SarinJoshua R Jarvela, CRNA; Elisabeth CaraLacey A Armistead, CRNA  ASA:  3E  ESTIMATED BLOOD LOSS:  Minimal.  DRAINS: none   SPECIMEN:   Appendix  COUNTS CORRECT:  YES  INDICATIONS FOR PROCEDURE: Bradley Farrell is a 73 y.o. (DOB: 05/26/1940) white  male whose primary care doctor is LE, THAO PHUONG, DO and comes to the OR for an appendectomy.   I discussed with the patient, the indications and potential complications of appendiceal surgery.  The potential complications include, but are not limited to, bleeding, open surgery, bowel resection, and the possibility of another diagnosis.  OPERATIVE NOTE:  The patient underwent a general endotracheal anesthetic as supervised by Anesthesiologist: Kennieth Radobert E Fitzgerald, MD CRNA: Epimenio SarinJoshua R Jarvela, CRNA; Elisabeth CaraLacey A Armistead, CRNA, General, in room #6.  The patient was given 2 g of cefotetan at the beginning of the procedure and the abdomen was prepped with ChloraPrep.  The patient did not have a foley catheter placed.  A time-out was held and surgical checklist run.  An infraumbilical incision was made with sharp dissection carried down to the abdominal cavity.  An 12 mm Hasson trocar was inserted through the infraumbilical incision and into the peritoneal cavity.  A 0 degree 10 mm laparoscope was inserted through a 12 mm Hasson trocar and the Hasson trocar secured with a 0 Vicryl suture.  I placed a 5 mm trocar in the right upper quadrant and 11 mm torcar in left lower quadrant and did abdominal exploration.     The right and left lobes of liver unremarkable.  Stomach was unremarkable.  The pelvic organs were unremarkable.  I saw no other intra-abdominal abnormality.  The patient had suppurative appendicitis with the appendix located right pelvic brim and partially retrocecal.  I to mobilize the cecum and roll it medially to get to the base of the appendix.  The appendix was purulent, but had not ruptured.  The mesentery of the appendix was divided with a Harmonic scalpel.  I got to the base of the appendix.  I then used a blue load 45 mm Ethicon Endo-GIA stapler and fired this across the base of the appendix.  I placed the appendix in EndoCatch bag and delivered the bag through the umbilical incision.  I irrigated the abdomen with 2,000 cc of saline.  After irrigating the abdomen, I then removed the trocars, in turn.  The umbilical port fascia was closed with 0 Vicryl suture x 3.   I closed the skin each site with a 5-0 Monocyrl suture and painted the wounds with Dermabond.  I then injected a total of 20 mL of 0.25% Marcaine at the incisions.  Sponge and needle count were correct at the end of the case.  The foley catheter was removed in the OR.  The patient was transferred to the recovery room in good  condition.  The patient tolerated the procedure well and it depends on the patient's post op clinical course as to when the patient could be discharged.   Ovidio Kinavid Gery Sabedra, MD, [facility] Pager: [phone] Office phone:  [phone]

## 2013-10-26 NOTE — Transfer of Care (Signed)
Immediate Anesthesia Transfer of Care Note  Patient: Bradley Farrell  Procedure(s) Performed: Procedure(s) (LRB): APPENDECTOMY LAPAROSCOPIC (N/A)  Patient Location: PACU  Anesthesia Type: General  Level of Consciousness: sedated, patient cooperative and responds to stimulation  Airway & Oxygen Therapy: Patient Spontanous Breathing and Patient connected to face mask oxgen  Post-op Assessment: Report given to PACU RN and Post -op Vital signs reviewed and stable  Post vital signs: Reviewed and stable  Complications: No apparent anesthesia complications

## 2013-10-26 NOTE — ED Notes (Signed)
Surgeon at bedside. Consent obtained. ED tech, Rodman PickleBreana inventorying pt's belongs. Security will lock up pt's belongings.
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
## 2013-10-26 NOTE — Anesthesia Postprocedure Evaluation (Signed)
  Anesthesia Post-op Note  Patient: Bradley Farrell  Procedure(s) Performed: Procedure(s): APPENDECTOMY LAPAROSCOPIC (N/A)  Patient Location: PACU  Anesthesia Type:General  Level of Consciousness: awake, alert  and oriented  Airway and Oxygen Therapy: Patient Spontanous Breathing  Post-op Pain: none  Post-op Assessment: Post-op Vital signs reviewed  Post-op Vital Signs: Reviewed  Last Vitals:  Filed Vitals:   10/26/13 1846  BP: 135/70  Pulse: 78  Temp: 36.8 C  Resp: 17    Complications: No apparent anesthesia complications

## 2013-10-26 NOTE — Progress Notes (Signed)
Patient came to unit at this time. Alert and orientedx4. Pleasant. Placed comfortably in bed. Denies pain.Bradley Farrell- Suzanne Kho RN

## 2013-10-26 NOTE — Anesthesia Preprocedure Evaluation (Addendum)
Anesthesia Evaluation  Patient identified by MRN, date of birth, ID band Patient awake    Reviewed: Allergy & Precautions, H&P , NPO status , Patient's Chart, lab work & pertinent test results  Airway Mallampati: III TM Distance: >3 FB Neck ROM: Full    Dental  (+) Edentulous Upper   Pulmonary former smoker,  breath sounds clear to auscultation        Cardiovascular hypertension, Rhythm:Regular Rate:Normal     Neuro/Psych negative neurological ROS  negative psych ROS   GI/Hepatic negative GI ROS, Neg liver ROS,   Endo/Other  negative endocrine ROS  Renal/GU negative Renal ROS     Musculoskeletal negative musculoskeletal ROS (+)   Abdominal   Peds  Hematology negative hematology ROS (+)   Anesthesia Other Findings   Reproductive/Obstetrics negative OB ROS                         Anesthesia Physical Anesthesia Plan  ASA: III and emergent  Anesthesia Plan: General   Post-op Pain Management:    Induction: Intravenous and Rapid sequence  Airway Management Planned: Oral ETT  Additional Equipment: None  Intra-op Plan:   Post-operative Plan: Extubation in OR  Informed Consent: I have reviewed the patients History and Physical, chart, labs and discussed the procedure including the risks, benefits and alternatives for the proposed anesthesia with the patient or authorized representative who has indicated his/her understanding and acceptance.   Dental advisory given  Plan Discussed with: CRNA  Anesthesia Plan Comments:         Anesthesia Quick Evaluation

## 2013-10-26 NOTE — ED Notes (Signed)
Patient is with EMS. Patient was brought over from [place] office. Patient c/o abdominal pain since 0230 this morning. Denies diarrhea but has been dry hiving. The MD at office felt it was his gallbladder so she sent him here.

## 2013-10-26 NOTE — Assessment & Plan Note (Addendum)
Will obtain basic lab work including CMET, Lipase, CBC as concern for pancreatitis.  No fevers or RLQ pain concerning for Appendicitis and has benign abdominal exam.  Doubt GB involvement as no RUQ pain.  No pain radiating to the back concerning for AAA or Lumbar Disc involvement.  Will most likely need CT scan abdomen as well pending labwork and response to GI cocktail
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
## 2013-10-26 NOTE — ED Notes (Signed)
OR tech here to take pt to OR 

## 2013-10-27 DIAGNOSIS — K3580 Unspecified acute appendicitis: Secondary | ICD-10-CM | POA: Diagnosis not present

## 2013-10-27 LAB — URINE CULTURE
CULTURE: NO GROWTH
Colony Count: NO GROWTH

## 2013-10-27 MED ORDER — HYDROCODONE-ACETAMINOPHEN 5-325 MG PO TABS: 1.0000 | ORAL_TABLET | ORAL | Status: DC | PRN

## 2013-10-27 MED ORDER — TAMSULOSIN HCL 0.4 MG PO CAPS: 0.4000 mg | ORAL_CAPSULE | Freq: Every day | ORAL | Status: DC

## 2013-10-27 NOTE — Progress Notes (Signed)
UR Completed Whitley Patchen Graves-Bigelow, RN,BSN [phone]  

## 2013-10-27 NOTE — Discharge Instructions (Signed)
[facility] ______[facility] °LAPAROSCOPIC SURGERY: POST OP INSTRUCTIONS °Always review your discharge instruction sheet given to you by the facility where your surgery was performed. °IF YOU HAVE DISABILITY OR FAMILY LEAVE FORMS, YOU MUST BRING THEM TO THE OFFICE FOR PROCESSING.   °DO NOT GIVE THEM TO YOUR DOCTOR. ° °1. A prescription for pain medication may be given to you upon discharge.  Take your pain medication as prescribed, if needed.  If narcotic pain medicine is not needed, then you may take acetaminophen (Tylenol) or ibuprofen (Advil) as needed. °2. Take your usually prescribed medications unless otherwise directed. °3. If you need a refill on your pain medication, please contact your pharmacy.  They will contact our office to request authorization. Prescriptions will not be filled after 5pm or on week-ends. °4. You should follow a light diet the first few days after arrival home, such as soup and crackers, etc.  Be sure to include lots of fluids daily. °5. Most patients will experience some swelling and bruising in the area of the incisions.  Ice packs will help.  Swelling and bruising can take several days to resolve.  °6. It is common to experience some constipation if taking pain medication after surgery.  Increasing fluid intake and taking a stool softener (such as Colace) will usually help or prevent this problem from occurring.  A mild laxative (Milk of Magnesia or Miralax) should be taken according to package instructions if there are no bowel movements after 48 hours. °7. Unless discharge instructions indicate otherwise, you may remove your bandages 24-48 hours after surgery, and you may shower at that time.  You may have steri-strips (small skin tapes) in place directly over the incision.  These strips should be left on the skin for 7-10 days.  If your surgeon used skin glue on the incision, you may shower in 24 hours.  The glue will flake off over the next 2-3 weeks.  Any sutures or  staples will be removed at the office during your follow-up visit. °8. ACTIVITIES:  You may resume regular (light) daily activities beginning the next day--such as daily self-care, walking, climbing stairs--gradually increasing activities as tolerated.  You may have sexual intercourse when it is comfortable.  Refrain from any heavy lifting or straining until approved by your doctor. °a. You may drive when you are no longer taking prescription pain medication, you can comfortably wear a seatbelt, and you can safely maneuver your car and apply brakes. °b. RETURN TO WORK:  __________________________________________________________ °9. You should see your doctor in the office for a follow-up appointment approximately 2-3 weeks after your surgery.  Make sure that you call for this appointment within a day or two after you arrive home to insure a convenient appointment time. °10. OTHER INSTRUCTIONS: __________________________________________________________________________________________________________________________ __________________________________________________________________________________________________________________________ °WHEN TO CALL YOUR DOCTOR: °1. Fever over 101.0 °2. Inability to urinate °3. Continued bleeding from incision. °4. Increased pain, redness, or drainage from the incision. °5. Increasing abdominal pain ° °The clinic staff is available to answer your questions during regular business hours.  Please don’t hesitate to call and ask to speak to one of the nurses for clinical concerns.  If you have a medical emergency, go to the nearest emergency room or call 911.  A surgeon from [facility] is always on call at the hospital. °[address], [address], [place], [place]  [address] ? [address] [address], [place],    [address] °[phone] ? [phone] ? FAX [phone] °Web site:   [email or web address]  Foley Catheter Care A Foley catheter is a soft, flexible  tube that is placed into the bladder to drain urine. A Foley catheter may be inserted if:  You leak urine or are not able to control when you urinate (urinary incontinence).  You are not able to urinate when you need to (urinary retention).  You had prostate surgery or surgery on the genitals.  You have certain medical conditions, such as multiple sclerosis, dementia, or a spinal cord injury. If you are going home with a Foley catheter in place, follow the instructions below. TAKING CARE OF THE CATHETER 1. Wash your hands with soap and water. 2. Using mild soap and warm water on a clean washcloth:  Clean the area on your body closest to the catheter insertion site using a circular motion, moving away from the catheter. Never wipe toward the catheter because this could sweep bacteria up into the urethra and cause infection.  Remove all traces of soap. Pat the area dry with a clean towel. For males, reposition the foreskin. 3. Attach the catheter to your leg so there is no tension on the catheter. Use adhesive tape or a leg strap. If you are using adhesive tape, remove any sticky residue left behind by the previous tape you used. 4. Keep the drainage bag below the level of the bladder, but keep it off the floor. 5. Check throughout the day to be sure the catheter is working and urine is draining freely. Make sure the tubing does not become kinked. 6. Do not pull on the catheter or try to remove it. Pulling could damage internal tissues. TAKING CARE OF THE DRAINAGE BAGS You will be given two drainage bags to take home. One is a large overnight drainage bag, and the other is a smaller leg bag that fits underneath clothing. You may wear the overnight bag at any time, but you should never wear the smaller leg bag at night. Follow the instructions below for how to empty, change, and clean your drainage bags. Emptying the Drainage Bag You must empty your drainage bag when it is  - full or at least  2-3 times a day. 1. Wash your hands with soap and water. 2. Keep the drainage bag below your hips, below the level of your bladder. This stops urine from going back into the tubing and into your bladder. 3. Hold the dirty bag over the toilet or a clean container. 4. Open the pour spout at the bottom of the bag and empty the urine into the toilet or container. Do not let the pour spout touch the toilet, container, or any other surface. Doing so can place bacteria on the bag, which can cause an infection. 5. Clean the pour spout with a gauze pad or cotton ball that has rubbing alcohol on it. 6. Close the pour spout. 7. Attach the bag to your leg with adhesive tape or a leg strap. 8. Wash your hands well. Changing the Drainage Bag Change your drainage bag once a month or sooner if it starts to smell bad or look dirty. Below are steps to follow when changing the drainage bag. 1. Wash your hands with soap and water. 2. Pinch off the rubber catheter so that urine does not spill out. 3. Disconnect the catheter tube from the drainage tube at the connection valve. Do not let the tubes touch any surface. 4. Clean the end of the catheter tube with an alcohol wipe. Use a different alcohol wipe  to clean the end of the drainage tube. 5. Connect the catheter tube to the drainage tube of the clean drainage bag. 6. Attach the new bag to the leg with adhesive tape or a leg strap. Avoid attaching the new bag too tightly. 7. Wash your hands well. Cleaning the Drainage Bag 1. Wash your hands with soap and water. 2. Wash the bag in warm, soapy water. 3. Rinse the bag thoroughly with warm water. 4. Fill the bag with a solution of white vinegar and water (1 cup vinegar to 1 qt warm water [.2 L vinegar to 1 L warm water]). Close the bag and soak it for 30 minutes in the solution. 5. Rinse the bag with warm water. 6. Hang the bag to dry with the pour spout open and hanging downward. 7. Store the clean bag (once it is  dry) in a clean plastic bag. 8. Wash your hands well. PREVENTING INFECTION  Wash your hands before and after handling your catheter.  Take showers daily and wash the area where the catheter enters your body. Do not take baths. Replace wet leg straps with dry ones, if this applies.  Do not use powders, sprays, or lotions on the genital area. Only use creams, lotions, or ointments as directed by your caregiver.  For females, wipe from front to back after each bowel movement.  Drink enough fluids to keep your urine clear or pale yellow unless you have a fluid restriction.  Do not let the drainage bag or tubing touch or lie on the floor.  Wear cotton underwear to absorb moisture and to keep your skin drier. SEEK MEDICAL CARE IF:   Your urine is cloudy or smells unusually bad.  Your catheter becomes clogged.  You are not draining urine into the bag or your bladder feels full.  Your catheter starts to leak. SEEK IMMEDIATE MEDICAL CARE IF:   You have pain, swelling, redness, or pus where the catheter enters the body.  You have pain in the abdomen, legs, lower back, or bladder.  You have a fever.  You see blood fill the catheter, or your urine is pink or red.  You have nausea, vomiting, or chills.  Your catheter gets pulled out. MAKE SURE YOU:   Understand these instructions.  Will watch your condition.  Will get help right away if you are not doing well or get worse. Document Released: 03/04/2005 Document Revised: 07/19/2013 Document Reviewed: 02/24/2012 [facility] Patient Information 2015 [place], [place]. This information is not intended to replace advice given to you by your health care provider. Make sure you discuss any questions you have with your health care provider.

## 2013-10-27 NOTE — Discharge Summary (Signed)
Patient ID: Bradley Deanhomas J Halteman MRN: [id number] DOB/AGE: 506/13/1942 73 y.o.  Admit date: 10/26/2013 Discharge date: 10/27/2013  Procedures: Laparoscopic appendectomy.  Consults: None  Reason for Admission: Bradley Farrell is a 89 y.o. (DOB: ) white male whose primary care physician is LE, THAO PHUONG, DO and [facility] for abdominal pain.  He went to [facility] yesterday and got some medicine for some tingling in his feet, Neurontin. He developed abdominal pain this AM. At first he thought it was the Neurontin that he just took. But as he got sicker, he went back to [facility]. They [facility].  He has no prior history of GI disease. He has not had a colonoscopy. He has not had any abdominal surgery.  CT scan - 10/26/2013 - There is thickening of the mesentery immediately adjacent to the appendix. The appendix is mildly prominent in thickness. Early  appendicitis must be of concern given this appearance.  CBC - 10/26/2013 - 16,900  Admission Diagnoses:  1. Acute appendicitis 2. HTN  Hospital Course: The patient was admitted and started on cefotetan. He was taken to the operating room where he and her when a laparoscopic appendectomy. The patient tolerated the procedure well. On postoperative day one he was tolerating a regular diet his pain was well-controlled. He was complaining of some pelvic pressure. He was voiding but unsure that he was completely voiding. A post void residual bladder scan was obtained which revealed 700 cc. A Foley catheter was placed and the patient was still stable for discharge home with an indwelling Foley catheter and a leg bag. Urology was consulted and he will followup for a voiding trial next week.  PE: Abdomen: Soft, appropriately tender, nondistended, active bowel sounds, incisions are clean, dry, and intact with Dermabond present  Discharge Diagnoses:  Active Problems:   Appendicitis  status post laparoscopic  appendectomy Urinary retention, discharge with Foley catheter  Discharge Medications:   Medication List         amLODipine 10 MG tablet  Commonly known as:  NORVASC  Take 1 tablet (10 mg total) by mouth daily.     aspirin EC 81 MG tablet  Take 81 mg by mouth every morning.     gabapentin 100 MG capsule  Commonly known as:  NEURONTIN  Take 1 capsule (100 mg total) by mouth at bedtime as needed.     HYDROcodone-acetaminophen 5-325 MG per tablet  Commonly known as:  NORCO/VICODIN  Take 1-2 tablets by mouth every 4 (four) hours as needed for moderate pain.     tamsulosin 0.4 MG Caps capsule  Commonly known as:  FLOMAX  Take 1 capsule (0.4 mg total) by mouth daily.        Discharge Instructions: Follow-up Information   Follow up with [facility] On 11/23/2013. (2:15pm, arrive by 1:45pm for paperwork)    Contact information:   [address]   [place] [address] [phone]       Follow up with [facility]. (They will call you with an appointment for the beginning of next week)    Contact information:   [address] [place] [address] [phone]      Signed: Letha CapeOSBORNE,KELLY E 10/27/2013, 11:34 AM  Agree with above.  Bradley Kinavid Li Bobo, MD, [facility] Pager: [phone] Office phone:  [phone]

## 2013-10-27 NOTE — Progress Notes (Signed)
Explained to pt home care of foley catheter. Expressed importance of hand hygiene and peri care. Demonstrated to pt how to empty the leg bag and the use of the leg straps. Also demonstrated to opt howe to hook and unhook catheter to attach to large gravity bag for when he goes to sleep. Pt verbalized understanding and has no further questions regarding the foley catheter at this time.

## 2013-10-27 NOTE — Progress Notes (Signed)
Pt discharged home via taxi cab. IV dc'd. Discharge teaching reviewed and pt verbalized understanding. No complaints of pain.

## 2013-10-28 ENCOUNTER — Encounter (HOSPITAL_COMMUNITY): Payer: Self-pay | Admitting: Surgery

## 2013-10-29 ENCOUNTER — Telehealth: Payer: Self-pay | Admitting: Family Medicine

## 2013-10-29 NOTE — Telephone Encounter (Signed)
Called to check up on him tos ee how he is doing. He lives primarily alone.

## 2013-11-11 ENCOUNTER — Emergency Department (HOSPITAL_COMMUNITY)
Admission: EM | Admit: 2013-11-11 | Discharge: 2013-11-11 | Disposition: A | Discharge status: Home / Self Care | Payer: BC Managed Care – PPO | Attending: Emergency Medicine | Admitting: Emergency Medicine

## 2013-11-11 ENCOUNTER — Encounter (HOSPITAL_COMMUNITY): Payer: Self-pay | Admitting: Emergency Medicine

## 2013-11-11 ENCOUNTER — Telehealth: Payer: Self-pay | Admitting: Family Medicine

## 2013-11-11 DIAGNOSIS — Z79899 Other long term (current) drug therapy: Secondary | ICD-10-CM | POA: Diagnosis not present

## 2013-11-11 DIAGNOSIS — M129 Arthropathy, unspecified: Secondary | ICD-10-CM | POA: Diagnosis not present

## 2013-11-11 DIAGNOSIS — Z7982 Long term (current) use of aspirin: Secondary | ICD-10-CM | POA: Insufficient documentation

## 2013-11-11 DIAGNOSIS — Z87891 Personal history of nicotine dependence: Secondary | ICD-10-CM | POA: Insufficient documentation

## 2013-11-11 DIAGNOSIS — G8929 Other chronic pain: Secondary | ICD-10-CM | POA: Diagnosis not present

## 2013-11-11 DIAGNOSIS — Z466 Encounter for fitting and adjustment of urinary device: Secondary | ICD-10-CM | POA: Diagnosis not present

## 2013-11-11 DIAGNOSIS — Z791 Long term (current) use of non-steroidal anti-inflammatories (NSAID): Secondary | ICD-10-CM | POA: Insufficient documentation

## 2013-11-11 DIAGNOSIS — Z880 Allergy status to penicillin: Secondary | ICD-10-CM | POA: Insufficient documentation

## 2013-11-11 DIAGNOSIS — I10 Essential (primary) hypertension: Secondary | ICD-10-CM | POA: Insufficient documentation

## 2013-11-11 NOTE — Telephone Encounter (Signed)
He was able to urinate this morning after foley removal .

## 2013-11-11 NOTE — ED Notes (Signed)
Patient reports no urge to urinate, no fullness, pressure or pain.

## 2013-11-11 NOTE — ED Notes (Signed)
Pt presents with foley cath in place, pt states he would like foley cath removed, cath in place x 2 weeks s/p lap appe, pt states cath leaks and he has had not rest.

## 2013-11-11 NOTE — ED Notes (Signed)
Updated patient on plan of care to provide urine specimen and then be discharged. Patient verbalizes understanding. Patient provided PO fluids.
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
## 2013-11-23 ENCOUNTER — Encounter (INDEPENDENT_AMBULATORY_CARE_PROVIDER_SITE_OTHER): Payer: Medicare Other

## 2014-02-22 ENCOUNTER — Telehealth: Payer: Self-pay | Admitting: Family Medicine

## 2014-02-22 NOTE — Telephone Encounter (Signed)
Spoke with patient and he stated that he has not had the flu shot but he can go to the drug store and get it cheaper.

## 2014-04-04 ENCOUNTER — Telehealth: Payer: Self-pay | Admitting: Family Medicine

## 2014-04-04 NOTE — Telephone Encounter (Signed)
Patient stated that it is none of out business when and where he gets his flu shot.

## 2014-11-07 ENCOUNTER — Other Ambulatory Visit: Payer: Self-pay | Admitting: Family Medicine

## 2014-11-29 NOTE — Telephone Encounter (Signed)
Called about flu shot

## 2014-12-09 ENCOUNTER — Other Ambulatory Visit: Payer: Self-pay | Admitting: Physician Assistant

## 2015-01-04 ENCOUNTER — Other Ambulatory Visit: Payer: Self-pay | Admitting: Physician Assistant

## 2015-01-22 ENCOUNTER — Ambulatory Visit (INDEPENDENT_AMBULATORY_CARE_PROVIDER_SITE_OTHER): Payer: Self-pay | Admitting: Family Medicine

## 2015-01-22 ENCOUNTER — Other Ambulatory Visit: Payer: Self-pay | Admitting: *Deleted

## 2015-01-22 VITALS — BP 144/76 | HR 96 | Temp 98.2°F | Resp 16 | Ht 70.5 in | Wt 244.8 lb

## 2015-01-22 DIAGNOSIS — I10 Essential (primary) hypertension: Secondary | ICD-10-CM

## 2015-01-22 LAB — GLUCOSE, POCT (MANUAL RESULT ENTRY): POC Glucose: 113 mg/dl — AB (ref 70–99)

## 2015-01-22 MED ORDER — AMLODIPINE BESYLATE 10 MG PO TABS: ORAL_TABLET | ORAL | Status: DC

## 2015-01-22 MED ORDER — GABAPENTIN 100 MG PO CAPS: ORAL_CAPSULE | ORAL | Status: DC
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
## 2015-01-22 NOTE — Patient Instructions (Signed)
You have a borderline blood sugar of 113. This qualifies as glucose intolerance but not diabetes. If you don't lose weight, you'll probably develop type 2 diabetes in the next couple years. Therefore, I think is a good idea the patient attention to your weight, exercise regularly as you probably do when you're working, and be very careful about eating carbohydrates such as potatoes, rice, pasta, and Brett.

## 2015-06-02 ENCOUNTER — Ambulatory Visit (INDEPENDENT_AMBULATORY_CARE_PROVIDER_SITE_OTHER): Payer: Self-pay | Admitting: Internal Medicine

## 2015-06-02 VITALS — BP 122/78 | HR 85 | Temp 99.2°F | Resp 17 | Ht 70.5 in | Wt 248.0 lb

## 2015-06-02 DIAGNOSIS — J111 Influenza due to unidentified influenza virus with other respiratory manifestations: Secondary | ICD-10-CM

## 2015-06-02 DIAGNOSIS — J4530 Mild persistent asthma, uncomplicated: Secondary | ICD-10-CM

## 2015-06-02 DIAGNOSIS — J22 Unspecified acute lower respiratory infection: Secondary | ICD-10-CM

## 2015-06-02 DIAGNOSIS — J988 Other specified respiratory disorders: Secondary | ICD-10-CM

## 2015-06-02 MED ORDER — HYDROCODONE-HOMATROPINE 5-1.5 MG/5ML PO SYRP: 5.0000 mL | ORAL_SOLUTION | Freq: Four times a day (QID) | ORAL | Status: DC | PRN

## 2015-06-02 MED ORDER — AZITHROMYCIN 250 MG PO TABS: ORAL_TABLET | ORAL | Status: DC

## 2015-06-02 MED ORDER — PREDNISONE 20 MG PO TABS: ORAL_TABLET | ORAL | Status: DC

## 2015-06-02 NOTE — Patient Instructions (Signed)
     IF you received an x-ray today, you will receive an invoice from [facility]. Please contact [facility] at [phone] with questions or concerns regarding your invoice.   IF you received labwork today, you will receive an invoice from [facility]. Please contact [facility] at [phone] with questions or concerns regarding your invoice.   Our billing staff will not be able to assist you with questions regarding bills from these companies.  You will be contacted with the lab results as soon as they are available. The fastest way to get your results is to activate your My Chart account. Instructions are located on the last page of this paperwork. If you have not heard from us regarding the results in 2 weeks, please contact this office.      
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
## 2015-06-05 ENCOUNTER — Ambulatory Visit: Payer: Self-pay

## 2015-06-06 ENCOUNTER — Telehealth: Payer: Self-pay

## 2015-06-06 NOTE — Telephone Encounter (Signed)
DOOLITTLE - The medicine he was prescribed is causing him nausea and other side effects.  Please call asap.  He came to be seen on Monday, but could not wait.   [phone]

## 2015-06-07 MED ORDER — PROMETHAZINE-DM 6.25-15 MG/5ML PO SYRP: 5.0000 mL | ORAL_SOLUTION | Freq: Four times a day (QID) | ORAL | Status: DC | PRN

## 2015-06-07 NOTE — Telephone Encounter (Signed)
Spoke with pt, he states the cough medication made him sick. The cough is not as bad but the congestion is still there. [facility] "[facility]" his chest? Possible sensitivity to Hydrocodone.

## 2015-06-09 NOTE — Telephone Encounter (Signed)
Spoke with pt, advised medication was sent in.

## 2016-04-15 ENCOUNTER — Other Ambulatory Visit: Payer: Self-pay | Admitting: Family Medicine

## 2016-04-15 DIAGNOSIS — I10 Essential (primary) hypertension: Secondary | ICD-10-CM

## 2016-04-15 NOTE — Telephone Encounter (Signed)
Spoke with patient. Notified of refill and the need to re-evaluate and establish with new PCP before this supply exhausted.  Meds ordered this encounter  Medications  . amLODipine (NORVASC) 10 MG tablet    Sig: TAKE ONE TABLET BY MOUTH ONCE DAILY    Dispense:  90 tablet    Refill:  0

## 2016-06-26 ENCOUNTER — Ambulatory Visit (INDEPENDENT_AMBULATORY_CARE_PROVIDER_SITE_OTHER): Payer: Self-pay | Admitting: Emergency Medicine

## 2016-06-26 ENCOUNTER — Other Ambulatory Visit: Payer: Self-pay | Admitting: Emergency Medicine

## 2016-06-26 VITALS — BP 142/80 | HR 86 | Temp 97.8°F | Resp 16 | Ht 70.5 in | Wt 243.6 lb

## 2016-06-26 DIAGNOSIS — I10 Essential (primary) hypertension: Secondary | ICD-10-CM

## 2016-06-26 LAB — COMPREHENSIVE METABOLIC PANEL
ALT: 26 IU/L (ref 0–44)
AST: 58 IU/L — ABNORMAL HIGH (ref 0–40)
Albumin/Globulin Ratio: 1.7 (ref 1.2–2.2)
Albumin: 4.3 g/dL (ref 3.5–4.8)
Alkaline Phosphatase: 98 IU/L (ref 39–117)
BUN / CREAT RATIO: 15 (ref 10–24)
BUN: 18 mg/dL (ref 8–27)
Bilirubin Total: 0.9 mg/dL (ref 0.0–1.2)
CO2: 27 mmol/L (ref 18–29)
Calcium: 9.4 mg/dL (ref 8.6–10.2)
Chloride: 100 mmol/L (ref 96–106)
Creatinine, Ser: 1.23 mg/dL (ref 0.76–1.27)
GFR, EST AFRICAN AMERICAN: 66 mL/min/{1.73_m2} (ref >59)
GFR, EST NON AFRICAN AMERICAN: 57 mL/min/{1.73_m2} — AB (ref >59)
Globulin, Total: 2.6 g/dL (ref 1.5–4.5)
Glucose: 103 mg/dL — ABNORMAL HIGH (ref 65–99)
POTASSIUM: 4.7 mmol/L (ref 3.5–5.2)
Sodium: 143 mmol/L (ref 134–144)
Total Protein: 6.9 g/dL (ref 6.0–8.5)

## 2016-06-26 LAB — CBC WITH DIFFERENTIAL/PLATELET
BASOS ABS: 0 10*3/uL (ref 0.0–0.2)
BASOS: 0 %
EOS (ABSOLUTE): 0.3 10*3/uL (ref 0.0–0.4)
Eos: 3 %
Hematocrit: 49.2 % (ref 37.5–51.0)
Hemoglobin: 17.1 g/dL (ref 13.0–17.7)
IMMATURE GRANS (ABS): 0 10*3/uL (ref 0.0–0.1)
Immature Granulocytes: 0 %
LYMPHS ABS: 2.8 10*3/uL (ref 0.7–3.1)
Lymphs: 31 %
MCH: 30.9 pg (ref 26.6–33.0)
MCHC: 34.8 g/dL (ref 31.5–35.7)
MCV: 89 fL (ref 79–97)
Monocytes Absolute: 0.9 10*3/uL (ref 0.1–0.9)
Monocytes: 10 %
NEUTROS ABS: 5 10*3/uL (ref 1.4–7.0)
Neutrophils: 56 %
Platelets: 225 10*3/uL (ref 150–379)
RBC: 5.54 x10E6/uL (ref 4.14–5.80)
RDW: 13.7 % (ref 12.3–15.4)
WBC: 9.1 10*3/uL (ref 3.4–10.8)

## 2016-06-26 LAB — LIPID PANEL
CHOL/HDL RATIO: 4.9 ratio (ref 0.0–5.0)
Cholesterol, Total: 143 mg/dL (ref 100–199)
HDL: 29 mg/dL — ABNORMAL LOW (ref >39)
LDL Calculated: 77 mg/dL (ref 0–99)
TRIGLYCERIDES: 183 mg/dL — AB (ref 0–149)
VLDL CHOLESTEROL CAL: 37 mg/dL (ref 5–40)

## 2016-06-26 LAB — HEMOGLOBIN A1C
Est. average glucose Bld gHb Est-mCnc: 120 mg/dL
HEMOGLOBIN A1C: 5.8 % — AB (ref 4.8–5.6)

## 2016-06-26 MED ORDER — AMLODIPINE BESYLATE 10 MG PO TABS: 10.0000 mg | ORAL_TABLET | Freq: Every day | ORAL | 3 refills | Status: DC
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
## 2016-06-26 NOTE — Patient Instructions (Addendum)
   IF you received an x-ray today, you will receive an invoice from [facility]. Please contact [facility] at [phone] with questions or concerns regarding your invoice.   IF you received labwork today, you will receive an invoice from [facility]. Please contact [facility] at [phone] with questions or concerns regarding your invoice.   Our billing staff will not be able to assist you with questions regarding bills from these companies.  You will be contacted with the lab results as soon as they are available. The fastest way to get your results is to activate your My Chart account. Instructions are located on the last page of this paperwork. If you have not heard from us regarding the results in 2 weeks, please contact this office.      Hypertension Hypertension is another name for high blood pressure. High blood pressure forces your heart to work harder to pump blood. This can cause problems over time. There are two numbers in a blood pressure reading. There is a top number (systolic) over a bottom number (diastolic). It is best to have a blood pressure below 120/80. Healthy choices can help lower your blood pressure. You may need medicine to help lower your blood pressure if:  Your blood pressure cannot be lowered with healthy choices.  Your blood pressure is higher than 130/80. Follow these instructions at home: Eating and drinking   If directed, follow the DASH eating plan. This diet includes:  Filling half of your plate at each meal with fruits and vegetables.  Filling one quarter of your plate at each meal with whole grains. Whole grains include whole wheat pasta, brown rice, and whole grain bread.  Eating or drinking low-fat dairy products, such as skim milk or low-fat yogurt.  Filling one quarter of your plate at each meal with low-fat (lean) proteins. Low-fat proteins include fish, skinless chicken, eggs, beans, and tofu.  Avoiding fatty meat,  cured and processed meat, or chicken with skin.  Avoiding premade or processed food.  Eat less than 1,500 mg of salt (sodium) a day.  Limit alcohol use to no more than 1 drink a day for nonpregnant women and 2 drinks a day for men. One drink equals 12 oz of beer, 5 oz of wine, or 1 oz of hard liquor. Lifestyle   Work with your doctor to stay at a healthy weight or to lose weight. Ask your doctor what the best weight is for you.  Get at least 30 minutes of exercise that causes your heart to beat faster (aerobic exercise) most days of the week. This may include walking, swimming, or biking.  Get at least 30 minutes of exercise that strengthens your muscles (resistance exercise) at least 3 days a week. This may include lifting weights or pilates.  Do not use any products that contain nicotine or tobacco. This includes cigarettes and e-cigarettes. If you need help quitting, ask your doctor.  Check your blood pressure at home as told by your doctor.  Keep all follow-up visits as told by your doctor. This is important. Medicines   Take over-the-counter and prescription medicines only as told by your doctor. Follow directions carefully.  Do not skip doses of blood pressure medicine. The medicine does not work as well if you skip doses. Skipping doses also puts you at risk for problems.  Ask your doctor about side effects or reactions to medicines that you should watch for. Contact a doctor if:  You think you are having a   reaction to the medicine you are taking.  You have headaches that keep coming back (recurring).  You feel dizzy.  You have swelling in your ankles.  You have trouble with your vision. Get help right away if:  You get a very bad headache.  You start to feel confused.  You feel weak or numb.  You feel faint.  You get very bad pain in your:  Chest.  Belly (abdomen).  You throw up (vomit) more than once.  You have trouble  breathing. Summary  Hypertension is another name for high blood pressure.  Making healthy choices can help lower blood pressure. If your blood pressure cannot be controlled with healthy choices, you may need to take medicine. This information is not intended to replace advice given to you by your health care provider. Make sure you discuss any questions you have with your health care provider. Document Released: 08/21/2007 Document Revised: 01/31/2016 Document Reviewed: 01/31/2016 Elsevier Interactive Patient Education  2017 Elsevier Inc.  

## 2016-06-28 ENCOUNTER — Telehealth: Payer: Self-pay | Admitting: Family Medicine

## 2016-06-28 NOTE — Telephone Encounter (Signed)
PT CALLED ABOUT LABS I GAVE THE MESSAGE OF NORMAL LABS PATIENT UNDERSTANDS

## 2016-07-10 ENCOUNTER — Ambulatory Visit (INDEPENDENT_AMBULATORY_CARE_PROVIDER_SITE_OTHER): Payer: Self-pay | Admitting: Emergency Medicine

## 2016-07-10 VITALS — BP 142/88 | HR 94 | Temp 98.4°F | Resp 16 | Ht 70.5 in | Wt 240.0 lb

## 2016-07-10 DIAGNOSIS — I10 Essential (primary) hypertension: Secondary | ICD-10-CM

## 2016-07-10 DIAGNOSIS — R42 Dizziness and giddiness: Secondary | ICD-10-CM
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
## 2016-07-10 NOTE — Patient Instructions (Addendum)
IF you received an x-ray today, you will receive an invoice from [facility]. Please contact [facility] at [phone] with questions or concerns regarding your invoice.   IF you received labwork today, you will receive an invoice from [facility]. Please contact [facility] at [phone] with questions or concerns regarding your invoice.   Our billing staff will not be able to assist you with questions regarding bills from these companies.  You will be contacted with the lab results as soon as they are available. The fastest way to get your results is to activate your My Chart account. Instructions are located on the last page of this paperwork. If you have not heard from [place] regarding the results in 2 weeks, please contact this office.     Transient Ischemic Attack A transient ischemic attack (TIA) is a "warning stroke" that causes stroke-like symptoms. A TIA does not cause lasting damage to the brain. The symptoms of a TIA can happen fast and do not last long. It is important to know the symptoms of a TIA and what to do. This can help prevent stroke or death. Follow these instructions at home:  Take medicines only as told by your doctor. Make sure you understand all of the instructions.  You may need to take aspirin or warfarin medicine. Warfarin needs to be taken exactly as told.  Taking too much or too little warfarin is dangerous. Blood tests must be done as often as told by your doctor. A PT blood test measures how long it takes for blood to clot. Your PT is used to calculate another value called an INR. Your PT and INR help your doctor adjust your warfarin dosage. He or she will make sure you are taking the right amount.  Food can cause problems with warfarin and affect the results of your blood tests. This is true for foods high in vitamin K. Eat the same amount of foods high in vitamin K each day. Foods high in vitamin K include spinach, kale, broccoli, cabbage,  collard and turnip greens, Brussels sprouts, peas, cauliflower, seaweed, and parsley. Other foods high in vitamin K include beef and pork liver, green tea, and soybean oil. Eat the same amount of foods high in vitamin K each day. Avoid big changes in your diet. Tell your doctor before changing your diet. Talk to a food specialist (dietitian) if you have questions.  Many medicines can cause problems with warfarin and affect your PT and INR. Tell your doctor about all medicines you take. This includes vitamins and dietary pills (supplements). Do not take or stop taking any prescribed or over-the-counter medicines unless your doctor tells you to.  Warfarin can cause more bruising or bleeding. Hold pressure over any cuts for longer than normal. Talk to your doctor about other side effects of warfarin.  Avoid sports or activities that may cause injury or bleeding.  Be careful when you shave, floss, or use sharp objects.  Avoid or drink very little alcohol while taking warfarin. Tell your doctor if you change how much alcohol you drink.  Tell your dentist and other doctors that you take warfarin before any procedures.  Follow your diet program as told, if you are given one.  Keep a healthy weight.  Stay active. Try to get at least 30 minutes of activity on all or most days.  Do not use any tobacco products, including cigarettes, chewing tobacco, or electronic cigarettes. If you need help quitting, ask your doctor.  Limit alcohol intake to no more than 1 drink per day for nonpregnant women and 2 drinks per day for men. One drink equals 12 ounces of beer, 5 ounces of wine, or 1 ounces of hard liquor.  Do not abuse drugs.  Keep your home safe so you do not fall. You can do this by:  Putting grab bars in the bedroom and bathroom.  Raising toilet seats.  Putting a seat in the shower.  Keep all follow-up visits as told by your doctor. This is important. Contact a doctor if:  Your  personality changes.  You have trouble swallowing.  You have double vision.  You are dizzy.  You have a fever. Get help right away if: These symptoms may be an emergency. Do not wait to see if the symptoms will go away. Get medical help right away. Call your local emergency services (911 in the [place] Do not drive yourself to the hospital.  You have sudden weakness or lose feeling (go numb), especially on one side of the body. This can affect your:  Face.  Arm.  Leg.  You have sudden trouble walking.  You have sudden trouble moving your arms or legs.  You have sudden confusion.  You have trouble talking.  You have trouble understanding.  You have sudden trouble seeing in one or both eyes.  You lose your balance.  Your movements are not smooth.  You have a sudden, very bad headache with no known cause.  You have new chest pain.  Your heartbeat is unsteady.  You are partly or totally unaware of what is going on around you. This information is not intended to replace advice given to you by your health care provider. Make sure you discuss any questions you have with your health care provider. Document Released: 12/12/2007 Document Revised: 11/06/2015 Document Reviewed: 06/09/2013 Elsevier Interactive Patient Education  2017 [employer].

## 2016-10-18 ENCOUNTER — Ambulatory Visit (INDEPENDENT_AMBULATORY_CARE_PROVIDER_SITE_OTHER): Payer: Medicare Other | Admitting: Emergency Medicine

## 2016-10-18 ENCOUNTER — Encounter: Payer: Self-pay | Admitting: Emergency Medicine

## 2016-10-18 VITALS — BP 131/77 | HR 80 | Temp 97.9°F | Resp 16 | Ht 70.0 in | Wt 237.6 lb

## 2016-10-18 DIAGNOSIS — S39012A Strain of muscle, fascia and tendon of lower back, initial encounter: Secondary | ICD-10-CM

## 2016-10-18 DIAGNOSIS — M542 Cervicalgia: Secondary | ICD-10-CM

## 2016-10-18 DIAGNOSIS — M791 Myalgia: Secondary | ICD-10-CM

## 2016-10-18 DIAGNOSIS — M25512 Pain in left shoulder: Secondary | ICD-10-CM

## 2016-10-18 DIAGNOSIS — M7918 Myalgia, other site: Secondary | ICD-10-CM | POA: Insufficient documentation

## 2016-10-18 DIAGNOSIS — S161XXA Strain of muscle, fascia and tendon at neck level, initial encounter: Secondary | ICD-10-CM | POA: Insufficient documentation

## 2016-10-18 MED ORDER — HYDROCODONE-ACETAMINOPHEN 5-325 MG PO TABS: 1.0000 | ORAL_TABLET | Freq: Every day | ORAL | 0 refills | Status: AC

## 2016-10-18 MED ORDER — DICLOFENAC SODIUM 75 MG PO TBEC: 75.0000 mg | DELAYED_RELEASE_TABLET | Freq: Two times a day (BID) | ORAL | 0 refills | Status: AC
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
## 2016-10-31 ENCOUNTER — Encounter: Payer: Self-pay | Admitting: Emergency Medicine

## 2016-10-31 ENCOUNTER — Ambulatory Visit (INDEPENDENT_AMBULATORY_CARE_PROVIDER_SITE_OTHER): Payer: Self-pay

## 2016-10-31 ENCOUNTER — Ambulatory Visit (INDEPENDENT_AMBULATORY_CARE_PROVIDER_SITE_OTHER): Payer: Self-pay | Admitting: Emergency Medicine

## 2016-10-31 VITALS — BP 138/80 | HR 84 | Temp 98.0°F | Resp 16 | Ht 70.0 in | Wt 237.4 lb

## 2016-10-31 DIAGNOSIS — M5030 Other cervical disc degeneration, unspecified cervical region: Secondary | ICD-10-CM

## 2016-10-31 DIAGNOSIS — M51369 Other intervertebral disc degeneration, lumbar region without mention of lumbar back pain or lower extremity pain: Secondary | ICD-10-CM

## 2016-10-31 DIAGNOSIS — S161XXD Strain of muscle, fascia and tendon at neck level, subsequent encounter: Secondary | ICD-10-CM

## 2016-10-31 DIAGNOSIS — M5441 Lumbago with sciatica, right side: Secondary | ICD-10-CM

## 2016-10-31 DIAGNOSIS — S39012D Strain of muscle, fascia and tendon of lower back, subsequent encounter: Secondary | ICD-10-CM

## 2016-10-31 DIAGNOSIS — M5431 Sciatica, right side: Secondary | ICD-10-CM

## 2016-10-31 DIAGNOSIS — S39012A Strain of muscle, fascia and tendon of lower back, initial encounter: Secondary | ICD-10-CM | POA: Insufficient documentation

## 2016-10-31 DIAGNOSIS — M5136 Other intervertebral disc degeneration, lumbar region: Secondary | ICD-10-CM

## 2016-10-31 DIAGNOSIS — M542 Cervicalgia: Secondary | ICD-10-CM

## 2016-10-31 MED ORDER — CYCLOBENZAPRINE HCL 10 MG PO TABS: 10.0000 mg | ORAL_TABLET | Freq: Three times a day (TID) | ORAL | 0 refills | Status: AC | PRN
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
## 2016-10-31 NOTE — Patient Instructions (Addendum)
IF you received an x-ray today, you will receive an invoice from [facility]. Please contact [facility] at [phone] with questions or concerns regarding your invoice.   IF you received labwork today, you will receive an invoice from [facility]. Please contact [facility] at [phone] with questions or concerns regarding your invoice.   Our billing staff will not be able to assist you with questions regarding bills from these companies.  You will be contacted with the lab results as soon as they are available. The fastest way to get your results is to activate your My Chart account. Instructions are located on the last page of this paperwork. If you have not heard from [place] regarding the results in 2 weeks, please contact this office.     Sciatica Sciatica is pain, numbness, weakness, or tingling along your sciatic nerve. The sciatic nerve starts in the lower back and goes down the back of each leg. Sciatica happens when this nerve is pinched or has pressure put on it. Sciatica usually goes away on its own or with treatment. Sometimes, sciatica may keep coming back (recur). Follow these instructions at home: Medicines  Take over-the-counter and prescription medicines only as told by your doctor.  Do not drive or use heavy machinery while taking prescription pain medicine. Managing pain  If directed, put ice on the affected area. ? Put ice in a plastic bag. ? Place a towel between your skin and the bag. ? Leave the ice on for 20 minutes, 2-3 times a day.  After icing, apply heat to the affected area before you exercise or as often as told by your doctor. Use the heat source that your doctor tells you to use, such as a moist heat pack or a heating pad. ? Place a towel between your skin and the heat source. ? Leave the heat on for 20-30 minutes. ? Remove the heat if your skin turns bright red. This is especially important if you are unable to feel pain, heat, or  cold. You may have a greater risk of getting burned. Activity  Return to your normal activities as told by your doctor. Ask your doctor what activities are safe for you. ? Avoid activities that make your sciatica worse.  Take short rests during the day. Rest in a lying or standing position. This is usually better than sitting to rest. ? When you rest for a long time, do some physical activity or stretching between periods of rest. ? Avoid sitting for a long time without moving. Get up and move around at least one time each hour.  Exercise and stretch regularly, as told by your doctor.  Do not lift anything that is heavier than 10 lb (4.5 kg) while you have symptoms of sciatica. ? Avoid lifting heavy things even when you do not have symptoms. ? Avoid lifting heavy things over and over.  When you lift objects, always lift in a way that is safe for your body. To do this, you should: ? Bend your knees. ? Keep the object close to your body. ? Avoid twisting. General instructions  Use good posture. ? Avoid leaning forward when you are sitting. ? Avoid hunching over when you are standing.  Stay at a healthy weight.  Wear comfortable shoes that support your feet. Avoid wearing high heels.  Avoid sleeping on a mattress that is too soft or too hard. You might have less pain if you sleep on a mattress that is firm enough  to support your back.  Keep all follow-up visits as told by your doctor. This is important. Contact a doctor if:  You have pain that: ? Wakes you up when you are sleeping. ? Gets worse when you lie down. ? Is worse than the pain you have had in the past. ? Lasts longer than 4 weeks.  You lose weight for without trying. Get help right away if:  You cannot control when you pee (urinate) or poop (have a bowel movement).  You have weakness in any of these areas and it gets worse. ? Lower back. ? Lower belly (pelvis). ? Butt (buttocks). ? Legs.  You have redness  or swelling of your back.  You have a burning feeling when you pee. This information is not intended to replace advice given to you by your health care provider. Make sure you discuss any questions you have with your health care provider. Document Released: 12/12/2007 Document Revised: 08/10/2015 Document Reviewed: 11/11/2014 Elsevier Interactive Patient Education  2018 [employer]  Cervical Sprain A cervical sprain is a stretch or tear in the tissues that connect bones (ligaments) in the neck. Most neck (cervical) sprains get better in 4-6 weeks. Follow these instructions at home: If you have a neck collar:  Wear it as told by your doctor. Do not take off (do not remove) the collar unless your doctor says that this is safe.  Ask your doctor before adjusting your collar.  If you have long hair, keep it outside of the collar.  Ask your doctor if you may take off the collar for cleaning and bathing. If you may take off the collar: ? Follow instructions from your doctor about how to take off the collar safely. ? Clean the collar by wiping it with mild soap and water. Let it air-dry all the way. ? If your collar has removable pads:  Take the pads out every 1-2 days.  Hand wash the pads with soap and water.  Let the pads air-dry all the way before you put them back in the collar. Do not dry them in a clothes dryer. Do not dry them with a hair dryer. ? Check your skin under the collar for irritation or sores. If you see any, tell your doctor. Managing pain, stiffness, and swelling  Use a cervical traction device, if told by your doctor.  If told, put heat on the affected area. Do this before exercises (physical therapy) or as often as told by your doctor. Use the heat source that your doctor recommends, such as a moist heat pack or a heating pad. ? Place a towel between your skin and the heat source. ? Leave the heat on for 20-30 minutes. ? Take the heat off (remove the heat) if your  skin turns bright red. This is very important if you cannot feel pain, heat, or cold. You may have a greater risk of getting burned.  Put ice on the affected area. ? Put ice in a plastic bag. ? Place a towel between your skin and the bag. ? Leave the ice on for 20 minutes, 2-3 times a day. Activity  Do not drive while wearing a neck collar. If you do not have a neck collar, ask your doctor if it is safe to drive.  Do not drive or use heavy machinery while taking prescription pain medicine or muscle relaxants, unless your doctor approves.  Do not lift anything that is heavier than 10 lb (4.5 kg) until your doctor  tells you that it is safe.  Rest as told by your doctor.  Avoid activities that make you feel worse. Ask your doctor what activities are safe for you.  Do exercises as told by your doctor or physical therapist. Preventing neck sprain  Practice good posture. Adjust your workstation to help with this, if needed.  Exercise regularly as told by your doctor or physical therapist.  Avoid activities that are risky or may cause a neck sprain (cervical sprain). General instructions  Take over-the-counter and prescription medicines only as told by your doctor.  Do not use any products that contain nicotine or tobacco. This includes cigarettes and e-cigarettes. If you need help quitting, ask your doctor.  Keep all follow-up visits as told by your doctor. This is important. Contact a doctor if:  You have pain or other symptoms that get worse.  You have symptoms that do not get better after 2 weeks.  You have pain that does not get better with medicine.  You start to have new, unexplained symptoms.  You have sores or irritated skin from wearing your neck collar. Get help right away if:  You have very bad pain.  You have any of the following in any part of your body: ? Loss of feeling (numbness). ? Tingling. ? Weakness.  You cannot move a part of your body (you have  paralysis).  Your activity level does not improve. Summary  A cervical sprain is a stretch or tear in the tissues that connect bones (ligaments) in the neck.  If you have a neck (cervical) collar, do not take off the collar unless your doctor says that this is safe.  Put ice on affected areas as told by your doctor.  Put heat on affected areas as told by your doctor.  Good posture and regular exercise can help prevent a neck sprain from happening again. This information is not intended to replace advice given to you by your health care provider. Make sure you discuss any questions you have with your health care provider. Document Released: 08/21/2007 Document Revised: 11/14/2015 Document Reviewed: 11/14/2015 Elsevier Interactive Patient Education  2017 [employer] Inc.  Back Pain, Adult Back pain is very common. The pain often gets better over time. The cause of back pain is usually not dangerous. Most people can learn to manage their back pain on their own. Follow these instructions at home: Watch your back pain for any changes. The following actions may help to lessen any pain you are feeling:  Stay active. Start with short walks on flat ground if you can. Try to walk farther each day.  Exercise regularly as told by your doctor. Exercise helps your back heal faster. It also helps avoid future injury by keeping your muscles strong and flexible.  Do not sit, drive, or stand in one place for more than 30 minutes.  Do not stay in bed. Resting more than 1-2 days can slow down your recovery.  Be careful when you bend or lift an object. Use good form when lifting: ? Bend at your knees. ? Keep the object close to your body. ? Do not twist.  Sleep on a firm mattress. Lie on your side, and bend your knees. If you lie on your back, put a pillow under your knees.  Take medicines only as told by your doctor.  Put ice on the injured area. ? Put ice in a plastic bag. ? Place a towel  between your skin and the bag. ? Leave  the ice on for 20 minutes, 2-3 times a day for the first 2-3 days. After that, you can switch between ice and heat packs.  Avoid feeling anxious or stressed. Find good ways to deal with stress, such as exercise.  Maintain a healthy weight. Extra weight puts stress on your back.  Contact a doctor if:  You have pain that does not go away with rest or medicine.  You have worsening pain that goes down into your legs or buttocks.  You have pain that does not get better in one week.  You have pain at night.  You lose weight.  You have a fever or chills. Get help right away if:  You cannot control when you poop (bowel movement) or pee (urinate).  Your arms or legs feel weak.  Your arms or legs lose feeling (numbness).  You feel sick to your stomach (nauseous) or throw up (vomit).  You have belly (abdominal) pain.  You feel like you may pass out (faint). This information is not intended to replace advice given to you by your health care provider. Make sure you discuss any questions you have with your health care provider. Document Released: 08/21/2007 Document Revised: 08/10/2015 Document Reviewed: 07/06/2013 Elsevier Interactive Patient Education  [employer].

## 2016-12-04 ENCOUNTER — Ambulatory Visit (INDEPENDENT_AMBULATORY_CARE_PROVIDER_SITE_OTHER): Payer: Medicare Other | Admitting: Orthopaedic Surgery

## 2016-12-04 ENCOUNTER — Encounter (INDEPENDENT_AMBULATORY_CARE_PROVIDER_SITE_OTHER): Payer: Self-pay

## 2016-12-10 ENCOUNTER — Encounter: Payer: Self-pay | Admitting: Emergency Medicine

## 2016-12-10 ENCOUNTER — Ambulatory Visit (INDEPENDENT_AMBULATORY_CARE_PROVIDER_SITE_OTHER): Payer: Self-pay | Admitting: Emergency Medicine

## 2016-12-10 ENCOUNTER — Ambulatory Visit (INDEPENDENT_AMBULATORY_CARE_PROVIDER_SITE_OTHER): Payer: Self-pay

## 2016-12-10 VITALS — BP 132/80 | HR 80 | Temp 98.4°F | Resp 17 | Ht 70.5 in | Wt 246.0 lb

## 2016-12-10 DIAGNOSIS — N529 Male erectile dysfunction, unspecified: Secondary | ICD-10-CM

## 2016-12-10 DIAGNOSIS — R0781 Pleurodynia: Secondary | ICD-10-CM

## 2016-12-10 DIAGNOSIS — S20212A Contusion of left front wall of thorax, initial encounter: Secondary | ICD-10-CM

## 2016-12-10 MED ORDER — TADALAFIL 20 MG PO TABS: 10.0000 mg | ORAL_TABLET | ORAL | 11 refills | Status: AC | PRN
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
## 2016-12-10 NOTE — Patient Instructions (Addendum)
     IF you received an x-ray today, you will receive an invoice from [facility]. Please contact [facility] at [phone] with questions or concerns regarding your invoice.   IF you received labwork today, you will receive an invoice from [facility]. Please contact [facility] at [phone] with questions or concerns regarding your invoice.   Our billing staff will not be able to assist you with questions regarding bills from these companies.  You will be contacted with the lab results as soon as they are available. The fastest way to get your results is to activate your My Chart account. Instructions are located on the last page of this paperwork. If you have not heard from us regarding the results in 2 weeks, please contact this office.     Chest Contusion, Adult A chest contusion is a deep bruise on the chest. Bruises happen when an injury causes bleeding under the skin. Signs of bruising include pain, puffiness (swelling), and skin that has changed from its normal color. The bruise may turn blue, purple, or yellow. Minor injuries may give you a painless bruise, but worse bruises may stay painful and swollen for a few weeks. Follow these instructions at home:  If directed, put ice on the injured area.  Put ice in a plastic bag.  Place a towel between your skin and the bag.  Leave the ice on for 20 minutes, 2-3 times per day.  Take over-the-counter and prescription medicines only as told by your doctor.  If told by your doctor, do deep-breathing exercises.  Do not lie down flat on your back. Keep your head and chest raised (elevated) when you rest or sleep.  Do not use any products that contain nicotine or tobacco, such as cigarettes and e-cigarettes. If you need help quitting, ask your doctor.  Do not lift anything that causes pain. Contact a doctor if:  Medicines or treatment do not help your swelling or pain.  You have more bruising.  You have more  swelling.  Your pain gets worse  Your symptoms do not get better in one week. Get help right away if:  You suddenly have a lot more pain.  You have trouble breathing.  You feel dizzy or weak.  You pass out (faint).  You have blood in your pee (urine) or poop (stool).  You cough up blood or you throw up (vomit) blood. Summary  A chest contusion is a deep bruise on the chest. Bruises happen when an injury causes bleeding under the skin.  Treatment may include resting and putting ice on the injured area.  Contact a doctor if you have trouble breathing or if your pain does not get better with treatment. This information is not intended to replace advice given to you by your health care provider. Make sure you discuss any questions you have with your health care provider. Document Released: 08/21/2007 Document Revised: 11/30/2015 Document Reviewed: 11/30/2015 Elsevier Interactive Patient Education  2017 [employer]vier Inc.  

## 2017-06-23 ENCOUNTER — Telehealth: Payer: Self-pay | Admitting: Emergency Medicine

## 2017-06-23 DIAGNOSIS — I10 Essential (primary) hypertension: Secondary | ICD-10-CM

## 2017-06-23 NOTE — Telephone Encounter (Addendum)
[facility]. Call back [phone] Aware of refill policy
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
